# Patient Record
Sex: Female | Born: 1940 | Race: White | Hispanic: No | Marital: Married | Smoking: Never smoker
Health system: Southern US, Community
[De-identification: ages and names within clinical notes are randomized; demographics above are authoritative.]

## PROBLEM LIST (undated history)

## (undated) DIAGNOSIS — E119 Type 2 diabetes mellitus without complications: Secondary | ICD-10-CM

## (undated) DIAGNOSIS — I10 Essential (primary) hypertension: Secondary | ICD-10-CM

## (undated) DIAGNOSIS — IMO0001 Reserved for inherently not codable concepts without codable children: Secondary | ICD-10-CM

## (undated) DIAGNOSIS — T8859XA Other complications of anesthesia, initial encounter: Secondary | ICD-10-CM

## (undated) DIAGNOSIS — R112 Nausea with vomiting, unspecified: Secondary | ICD-10-CM

## (undated) DIAGNOSIS — J449 Chronic obstructive pulmonary disease, unspecified: Secondary | ICD-10-CM

## (undated) DIAGNOSIS — N390 Urinary tract infection, site not specified: Secondary | ICD-10-CM

## (undated) DIAGNOSIS — Z9889 Other specified postprocedural states: Secondary | ICD-10-CM

## (undated) DIAGNOSIS — T4145XA Adverse effect of unspecified anesthetic, initial encounter: Secondary | ICD-10-CM

## (undated) DIAGNOSIS — W19XXXA Unspecified fall, initial encounter: Secondary | ICD-10-CM

## (undated) HISTORY — PX: OTHER SURGICAL HISTORY: SHX169

## (undated) HISTORY — PX: RECTAL SURGERY: SHX760

## (undated) HISTORY — PX: ABDOMINAL HYSTERECTOMY: SHX81

## (undated) HISTORY — PX: HERNIA REPAIR: SHX51

---

## 1999-09-08 ENCOUNTER — Encounter: Payer: Self-pay | Admitting: Internal Medicine

## 1999-09-08 ENCOUNTER — Ambulatory Visit (HOSPITAL_COMMUNITY): Admission: RE | Admit: 1999-09-08 | Discharge: 1999-09-08 | Payer: Self-pay | Admitting: Internal Medicine

## 1999-09-15 ENCOUNTER — Ambulatory Visit (HOSPITAL_COMMUNITY): Admission: RE | Admit: 1999-09-15 | Discharge: 1999-09-15 | Payer: Self-pay | Admitting: Gastroenterology

## 2000-09-21 ENCOUNTER — Ambulatory Visit (HOSPITAL_COMMUNITY): Admission: RE | Admit: 2000-09-21 | Discharge: 2000-09-21 | Payer: Self-pay | Admitting: Internal Medicine

## 2000-09-21 ENCOUNTER — Encounter: Payer: Self-pay | Admitting: Internal Medicine

## 2002-04-10 ENCOUNTER — Encounter: Payer: Self-pay | Admitting: Internal Medicine

## 2002-04-10 ENCOUNTER — Encounter: Admission: RE | Admit: 2002-04-10 | Discharge: 2002-04-10 | Payer: Self-pay | Admitting: Internal Medicine

## 2002-08-31 ENCOUNTER — Encounter: Payer: Self-pay | Admitting: Emergency Medicine

## 2002-08-31 ENCOUNTER — Inpatient Hospital Stay (HOSPITAL_COMMUNITY): Admission: EM | Admit: 2002-08-31 | Discharge: 2002-09-04 | Payer: Self-pay | Admitting: Emergency Medicine

## 2002-09-01 ENCOUNTER — Encounter: Payer: Self-pay | Admitting: Internal Medicine

## 2003-09-05 ENCOUNTER — Ambulatory Visit (HOSPITAL_COMMUNITY): Admission: RE | Admit: 2003-09-05 | Discharge: 2003-09-05 | Payer: Self-pay | Admitting: Gastroenterology

## 2004-03-13 ENCOUNTER — Encounter: Admission: RE | Admit: 2004-03-13 | Discharge: 2004-03-13 | Payer: Self-pay | Admitting: Internal Medicine

## 2004-03-31 ENCOUNTER — Encounter: Admission: RE | Admit: 2004-03-31 | Discharge: 2004-03-31 | Payer: Self-pay | Admitting: Internal Medicine

## 2004-09-09 ENCOUNTER — Ambulatory Visit (HOSPITAL_COMMUNITY): Admission: RE | Admit: 2004-09-09 | Discharge: 2004-09-09 | Payer: Self-pay | Admitting: Family Medicine

## 2006-01-04 ENCOUNTER — Encounter: Admission: RE | Admit: 2006-01-04 | Discharge: 2006-01-04 | Payer: Self-pay | Admitting: Interventional Cardiology

## 2006-01-05 ENCOUNTER — Inpatient Hospital Stay (HOSPITAL_BASED_OUTPATIENT_CLINIC_OR_DEPARTMENT_OTHER): Admission: RE | Admit: 2006-01-05 | Discharge: 2006-01-05 | Payer: Self-pay | Admitting: Interventional Cardiology

## 2006-02-02 ENCOUNTER — Ambulatory Visit (HOSPITAL_BASED_OUTPATIENT_CLINIC_OR_DEPARTMENT_OTHER): Admission: RE | Admit: 2006-02-02 | Discharge: 2006-02-02 | Payer: Self-pay | Admitting: Surgery

## 2006-02-02 ENCOUNTER — Encounter (INDEPENDENT_AMBULATORY_CARE_PROVIDER_SITE_OTHER): Payer: Self-pay | Admitting: *Deleted

## 2006-03-08 ENCOUNTER — Encounter (INDEPENDENT_AMBULATORY_CARE_PROVIDER_SITE_OTHER): Payer: Self-pay | Admitting: *Deleted

## 2006-03-08 ENCOUNTER — Ambulatory Visit (HOSPITAL_COMMUNITY): Admission: RE | Admit: 2006-03-08 | Discharge: 2006-03-08 | Payer: Self-pay | Admitting: General Surgery

## 2006-12-03 ENCOUNTER — Ambulatory Visit (HOSPITAL_COMMUNITY): Admission: RE | Admit: 2006-12-03 | Discharge: 2006-12-03 | Payer: Self-pay | Admitting: Internal Medicine

## 2006-12-22 ENCOUNTER — Ambulatory Visit: Payer: Self-pay | Admitting: Vascular Surgery

## 2007-01-28 ENCOUNTER — Encounter: Admission: RE | Admit: 2007-01-28 | Discharge: 2007-01-28 | Payer: Self-pay | Admitting: Internal Medicine

## 2007-07-07 ENCOUNTER — Encounter: Admission: RE | Admit: 2007-07-07 | Discharge: 2007-07-07 | Payer: Self-pay | Admitting: Internal Medicine

## 2007-10-13 ENCOUNTER — Encounter: Admission: RE | Admit: 2007-10-13 | Discharge: 2007-10-13 | Payer: Self-pay | Admitting: Internal Medicine

## 2007-11-11 ENCOUNTER — Encounter: Admission: RE | Admit: 2007-11-11 | Discharge: 2007-11-11 | Payer: Self-pay | Admitting: Internal Medicine

## 2008-02-02 ENCOUNTER — Ambulatory Visit (HOSPITAL_COMMUNITY): Admission: RE | Admit: 2008-02-02 | Discharge: 2008-02-03 | Payer: Self-pay | Admitting: Surgery

## 2008-07-09 ENCOUNTER — Encounter: Admission: RE | Admit: 2008-07-09 | Discharge: 2008-07-09 | Payer: Self-pay | Admitting: Internal Medicine

## 2008-07-09 ENCOUNTER — Ambulatory Visit (HOSPITAL_COMMUNITY): Admission: RE | Admit: 2008-07-09 | Discharge: 2008-07-09 | Payer: Self-pay | Admitting: Internal Medicine

## 2008-11-08 ENCOUNTER — Encounter: Admission: RE | Admit: 2008-11-08 | Discharge: 2008-11-08 | Payer: Self-pay | Admitting: Surgery

## 2009-04-10 ENCOUNTER — Encounter: Admission: RE | Admit: 2009-04-10 | Discharge: 2009-04-10 | Payer: Self-pay | Admitting: Orthopedic Surgery

## 2009-04-24 ENCOUNTER — Encounter: Admission: RE | Admit: 2009-04-24 | Discharge: 2009-04-24 | Payer: Self-pay | Admitting: Orthopedic Surgery

## 2009-05-09 ENCOUNTER — Encounter: Admission: RE | Admit: 2009-05-09 | Discharge: 2009-05-09 | Payer: Self-pay | Admitting: Orthopedic Surgery

## 2009-06-24 ENCOUNTER — Encounter: Admission: RE | Admit: 2009-06-24 | Discharge: 2009-06-24 | Payer: Self-pay | Admitting: Surgery

## 2009-11-08 ENCOUNTER — Encounter: Admission: RE | Admit: 2009-11-08 | Discharge: 2009-11-08 | Payer: Self-pay | Admitting: Internal Medicine

## 2009-12-25 ENCOUNTER — Encounter: Payer: Self-pay | Admitting: Endocrinology

## 2010-01-03 ENCOUNTER — Encounter: Admission: RE | Admit: 2010-01-03 | Discharge: 2010-01-03 | Payer: Self-pay | Admitting: Internal Medicine

## 2010-01-06 ENCOUNTER — Ambulatory Visit: Payer: Self-pay | Admitting: Endocrinology

## 2010-01-06 DIAGNOSIS — I10 Essential (primary) hypertension: Secondary | ICD-10-CM | POA: Insufficient documentation

## 2010-01-06 DIAGNOSIS — J45909 Unspecified asthma, uncomplicated: Secondary | ICD-10-CM

## 2010-01-06 DIAGNOSIS — G629 Polyneuropathy, unspecified: Secondary | ICD-10-CM

## 2010-01-06 DIAGNOSIS — E109 Type 1 diabetes mellitus without complications: Secondary | ICD-10-CM | POA: Insufficient documentation

## 2010-01-06 DIAGNOSIS — F419 Anxiety disorder, unspecified: Secondary | ICD-10-CM

## 2010-01-13 ENCOUNTER — Ambulatory Visit: Payer: Self-pay | Admitting: Endocrinology

## 2010-02-04 ENCOUNTER — Ambulatory Visit: Payer: Self-pay | Admitting: Endocrinology

## 2010-02-18 ENCOUNTER — Telehealth: Payer: Self-pay | Admitting: Endocrinology

## 2010-02-25 ENCOUNTER — Ambulatory Visit: Payer: Self-pay | Admitting: Endocrinology

## 2010-03-17 ENCOUNTER — Ambulatory Visit: Payer: Self-pay | Admitting: Internal Medicine

## 2010-04-16 ENCOUNTER — Ambulatory Visit: Payer: Self-pay | Admitting: Internal Medicine

## 2010-04-16 ENCOUNTER — Ambulatory Visit: Payer: Self-pay | Admitting: Endocrinology

## 2010-06-11 ENCOUNTER — Encounter: Admission: RE | Admit: 2010-06-11 | Discharge: 2010-06-11 | Payer: Self-pay | Admitting: Surgery

## 2010-09-16 ENCOUNTER — Encounter
Admission: RE | Admit: 2010-09-16 | Discharge: 2010-09-16 | Payer: Self-pay | Source: Home / Self Care | Attending: Internal Medicine | Admitting: Internal Medicine

## 2010-10-25 ENCOUNTER — Encounter: Payer: Self-pay | Admitting: Family Medicine

## 2010-10-26 ENCOUNTER — Encounter: Payer: Self-pay | Admitting: Surgery

## 2010-11-04 NOTE — Progress Notes (Signed)
Summary: samples  Phone Note Call from Patient   Caller: Patient [phone] Summary of Call: pt called stating that she was told by MD at last OV that she could call back to pick up samples of Lantus, Novolog and Humolog as they were not available at OV. please advise Initial call taken by: Margaret Pyle, CMA,  Feb 18, 2010 9:33 AM  Follow-up for Phone Call        please note policy.  i did not tell pt she could come in just to pick up samples.  i'll check again at your appt next week. Follow-up by: Minus Breeding MD,  Feb 18, 2010 9:38 AM  Additional Follow-up for Phone Call Additional follow up Details #1::        pt informed via VM Additional Follow-up by: Margaret Pyle, CMA,  Feb 18, 2010 9:51 AM

## 2010-11-04 NOTE — Assessment & Plan Note (Signed)
Summary: 3 wk f/u // # / cd   Vital Signs:  Patient profile:   70 year old female Height:      61 inches (154.94 cm) Weight:      189.13 pounds (85.97 kg) O2 Sat:      93 % on Room air Temp:     97.4 degrees F (36.33 degrees C) oral Pulse rate:   98 / minute BP sitting:   130 / 64  (left arm) Cuff size:   large  Vitals Entered By: Josph Macho RMA (Feb 25, 2010 9:12 AM)  O2 Flow:  Room air CC: 3 week follow up/ CF Is Patient Diabetic? Yes   Referring Provider:  Polite Primary Provider:  Polite  CC:  3 week follow up/ CF.  History of Present Illness: she brings a record of her cbg's which i have reviewed today.  it is consistently 200's in am.  then it often goes down to 100 before lunch and at hs.  it is much higher in am than at hs, even if she has milk only at hs.  pt states she feels well in general, except for fatigue.    Current Medications (verified): 1)  Lantus 100 Unit/ml Soln (Insulin Glargine) .... 40 Units At Bedtime 2)  Lyrica 75 Mg Caps (Pregabalin) .... Two Times A Day As Needed 3)  Diovan Hct 160-12.5 Mg Tabs (Valsartan-Hydrochlorothiazide) .... Once Daily 4)  Citalopram Hydrobromide 20 Mg Tabs (Citalopram Hydrobromide) .... Once Daily 5)  Furosemide 20 Mg Tabs (Furosemide) .... Once Daily As Needed 6)  Alprazolam 2 Mg Tabs (Alprazolam) .... Gtts As Needed 7)  Advair Albuterol Inhaler .... As Needed 8)  Singulair 10 Mg Tabs (Montelukast Sodium) .... Once Daily 9)  Novolog 100 Unit/ml Soln (Insulin Aspart) .... Three Times A Day (Just Before Each Meal) 35-10-35 Units, and Pen Needles 4x A Day 10)  Potassium .Marland Kitchen.. 1 By Mouth Qd  Allergies (verified): 1)  ! Lodine  Past History:  Past Medical History: Last updated: 01/06/2010 ANXIETY (ICD-300.00) ASTHMA (ICD-493.90) HYPERTENSION (ICD-401.9) POLYNEUROPATHY (ICD-357.9) IDDM (ICD-250.01)  Review of Systems  The patient denies hypoglycemia.    Physical Exam  General:  normal appearance.   Psych:   Alert and cooperative; normal mood and affect; normal attention span and concentration.     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 50 units at bedtime 2)  Novolog 100 Unit/ml Soln (Insulin aspart) .... Three times a day (just before each meal) 30-10-30 units, and pen needles 4x a day  Other Orders: Est. Patient Level III (16109)  Patient Instructions: 1)  check your blood sugar 2 times a day.  vary the time of day when you check, between before the 3 meals, and at bedtime.  also check if you have symptoms of your blood sugar being too high or too low.  please keep a record of the readings and bring it to your next appointment here.  please call us sooner if you are having low blood sugar episodes. 2)  increase lantus to 50 units at bedtime 3)  change novolog to a three times a day (just before each meal), 30-10-30 units. 4)  Please schedule a follow-up appointment in 6 weeks.

## 2010-11-04 NOTE — Assessment & Plan Note (Signed)
Summary: Pulmonary/  summary ov with hfa only 25%/ dulera count off 93   Copy to:  Polite Primary Provider/Referring Provider:  Polite  CC:  followup on pfts and c/o chest tightness and increased sob when outside and at night wake up gasping.  History of Present Illness: 5 yowf never smoker prematurely born at 7 months and 2 lbs and lifelong asthma but never learned how to use inhalers effectively.  March 17, 2010  1st pulmonary office eval  cc doe x across parking lot, harris teeter ok if goes slow.  moderate variability, better after saba and assoc with nasal congestion, itching and sneezing.  No worse lately (learned to live with it) rec try dulera 200 > helped some but poor technique noted, gerd diet reviewed.  April 16, 2010 ov followup on pfts, c/o chest tightness and increased sob when outside and at night wake up gasping  - still not using correctly. still needing rescue one daily on avg ? proaire. Pt denies any significant sore throat, dysphagia, itching, sneezing,  nasal congestion or excess secretions,  fever, chills, sweats, unintended wt loss, pleuritic or exertional cp, hempoptysis, change in activity tolerance   leg swelling.  Pt also denies any obvious fluctuation in symptoms with weather or environmental change or other alleviating or aggravating factors.           Preventive Screening-Counseling & Management  Alcohol-Tobacco     Smoking Status: never  Current Medications (verified): 1)  Lantus 100 Unit/ml Soln (Insulin Glargine) .... 60 Units At Bedtime 2)  Novolog 100 Unit/ml Soln (Insulin Aspart) .... Three Times A Day (Just Before Each Meal) 20-10-30 Units, and Pen Needles 4x A Day 3)  Lyrica 75 Mg Caps (Pregabalin) .... Two Times A Day As Needed 4)  Diovan Hct 160-12.5 Mg Tabs (Valsartan-Hydrochlorothiazide) .... Once Daily 5)  Citalopram Hydrobromide 20 Mg Tabs (Citalopram Hydrobromide) .... Once Daily 6)  Furosemide 20 Mg Tabs (Furosemide) .... Once Daily As  Needed 7)  Alprazolam 2 Mg Tabs (Alprazolam) .Marland Kitchen.. 1 By Mouth As Needed 8)  Albuterol Sulfate (2.5 Mg/44ml) 0.083% Nebu (Albuterol Sulfate) .Marland Kitchen.. 1 Vial in Nebulizer Once Daily As Needed 9)  Dulera 200-5 Mcg/act Aero (Mometasone Furo-Formoterol Fum) .... 2 Puffs First Thing  in Am and 2 Puffs Again in Pm About 12 Hours Later  Allergies: 1)  ! 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)  Social History: Smoking Status:  never  Vital Signs:  Patient profile:   70 year old female Height:      61 inches Weight:      193 pounds BMI:     36.60 O2 Sat:      96 % on Room air Temp:     98.4 degrees F oral Pulse rate:   94 / minute BP sitting:   120 / 60  (left arm) Cuff size:   regular  Vitals Entered By: Kandice Hams CMA (April 16, 2010 9:54 AM)  O2 Flow:  Room air CC: followup on pfts, c/o chest tightness and increased sob when outside and at night wake up gasping   Physical Exam  Additional Exam:  obese pleasant wf nad wt 191 March 17, 2010 > 193 April 16, 2010  HEENT: nl dentition, turbinates, and orophanx. Nl external ear canals without cough reflex NECK :  without JVD/Nodes/TM/ nl carotid upstrokes bilaterally LUNGS: no acc muscle use, distant wheeze pan exp bilaterally  CV:  RRR  no s3 or murmur or increase in P2, no edema  ABD:  soft and nontender with nl excursion in the supine position. No bruits or organomegaly, bowel sounds nl MS:  warm without deformities, calf tenderness, cyanosis or clubbing     Impression & Recommendations:  Problem # 1:  ASTHMA (ICD-493.90) Moderate chronic asthma     DDX of  difficult airways managment all start with A and  include Adherence, Ace Inhibitors, Acid Reflux, Active Sinus Disease, Alpha  1 Antitripsin deficiency, Anxiety masquerading as Airways dz,  ABPA,  allergy(esp in young), Aspiration (esp in elderly), Adverse effects of DPI,  Active smokers, plus one B  = Beta blocker use..    Adherence is biggest hurdle. has not triggered dulera but 27 times in a month and still not using effectively.  I spent extra time with the patient today explaining optimal mdi  technique.  This improved from  10-25% but no better.    Acid reflux diet reviewed.  Medications Added to Medication List This Visit: 1)  Alprazolam 2 Mg Tabs (Alprazolam) .Marland Kitchen.. 1 by mouth as needed  Other Orders: Est. Patient Level IV (04540) HFA Instruction [phone])  Patient Instructions: 1)  Work on inhaler technique:  relax and blow all the way out then take a nice smooth deep breath back in, triggering the inhaler at same time you start breathing in and hold a few seconds  2)  Please schedule a follow-up appointment in 6 weeks, sooner if needed  Prescriptions: DULERA 200-5 MCG/ACT AERO (MOMETASONE FURO-FORMOTEROL FUM) 2 puffs first thing  in am and 2 puffs again in pm about 12 hours later  #1 x 11   Entered and Authorized by:   Nyoka Cowden MD   Signed by:   Nyoka Cowden MD on 04/16/2010   Method used:   Electronically to        [facility] [address] [phone]* (retail)       [address] [place], [place]  [address]       Ph: [phone]       Fax: [phone]   RxID:   [id number]

## 2010-11-04 NOTE — Assessment & Plan Note (Signed)
Summary: NEW ENDO MEDICARE DIABETES PT-PKG-#PER BELINDA/DR POLITE-STC   Vital Signs:  Patient profile:   70 year old female Height:      61 inches (154.94 cm) Weight:      189.50 pounds (86.14 kg) BMI:     35.94 O2 Sat:      90 % on Room air Temp:     98.2 degrees F (36.78 degrees C) oral Pulse rate:   88 / minute BP sitting:   128 / 68  (left arm) Cuff size:   large  Vitals Entered By: Josph Macho RMA (January 06, 2010 9:43 AM)  O2 Flow:  Room air CC: New Endo: Diabetes/ CF Is Patient Diabetic? Yes   Referring Provider:  Polite Primary Provider:  Polite  CC:  New Endo: Diabetes/ CF.  History of Present Illness: pt states 18 years h/o dm.  it is complicated by retinopathy.  she has been on insulin since soon after dx of dm.  she takes lantus, and prn novolog.  no cbg record, but states cbg's are not well-controlled.  she describes it as extremely variable.  it is in general higher as the day goes on. pt says her diet and exercise are "poor."   symptomatically, pt states few years of moderate numbness of the legs, and associated pain.   Current Medications (verified): 1)  Lantus 100 Unit/ml Soln (Insulin Glargine) .... 37 Units Qam & 35 Units Qpm 2)  Sliding Scale 3)  Lyrica 75 Mg Caps (Pregabalin) .... Two Times A Day As Needed 4)  Diovan Hct 160-12.5 Mg Tabs (Valsartan-Hydrochlorothiazide) .... Once Daily 5)  Citalopram Hydrobromide 20 Mg Tabs (Citalopram Hydrobromide) .... Once Daily 6)  Metformin Hcl 500 Mg Tabs (Metformin Hcl) .... 2 Two Times A Day 7)  Glyburide 5 Mg Tabs (Glyburide) .... 2 Two Times A Day 8)  Furosemide 20 Mg Tabs (Furosemide) .... Once Daily As Needed 9)  Alprazolam 2 Mg Tabs (Alprazolam) .... Gtts As Needed 10)  Advair Albuterol Inhaler .... As Needed 11)  Singulair 10 Mg Tabs (Montelukast Sodium) .... Once Daily  Allergies (verified): 1)  ! Lodine  Past History:  Past Medical History: ANXIETY (ICD-300.00) ASTHMA  (ICD-493.90) HYPERTENSION (ICD-401.9) POLYNEUROPATHY (ICD-357.9) IDDM (ICD-250.01)  Family History: Reviewed history and no changes required. dm:  mother (deceased), and 2 brother (1 deceased), 2 sisters  Social History: Reviewed history and no changes required. married retired never a smoker  Review of Systems       The patient complains of weight gain, headaches, and depression.         denies blurry vision, chest pain, n/v, urinary frequency, excessive diaphoresis, memory loss, and menopausal sxs.  she reports leg swelling.  she attributes doe to asthma.  she reports muscle cramps, rhinorrhea, and easy bruising   Physical Exam  Extremities:  no deformity.  no ulcer on the feet.  feet are of normal color and temp.  there is bilateral onychomycosis, and ingrown toenails. trace right pedal edema and trace left pedal edema.   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  these complicate the rx of #1  Medications Added to Medication List This Visit: 1)  Lantus 100 Unit/ml Soln (Insulin glargine) .... 37 units qam & 35 units  qpm 2)  Lantus 100 Unit/ml Soln (Insulin glargine) .... 35 units two times a day 3)  Sliding Scale  4)  Lyrica 75 Mg Caps (Pregabalin) .... Two times a day as needed 5)  Diovan Hct 160-12.5 Mg Tabs (Valsartan-hydrochlorothiazide) .... Once daily 6)  Citalopram Hydrobromide 20 Mg Tabs (Citalopram hydrobromide) .... Once daily 7)  Metformin Hcl 500 Mg Tabs (Metformin hcl) .... 2 two times a day 8)  Glyburide 5 Mg Tabs (Glyburide) .... 2 two times a day 9)  Furosemide 20 Mg Tabs (Furosemide) .... Once daily as needed 10)  Alprazolam 2 Mg Tabs (Alprazolam) .... Gtts as needed 11)  Advair Albuterol Inhaler  .... As needed 12)   Singulair 10 Mg Tabs (Montelukast sodium) .... Once daily 13)  Novolog 100 Unit/ml Soln (Insulin aspart) .Marland Kitchen.. 10 units three times a day (just before each meal)  Other Orders: Consultation Level IV (16109)   Patient Instructions: 1)  good diet and exercise habits significanly improve the control of your diabetes.  high blood sugar is very risky to your health.  you should see an eye doctor every year. 2)  controlling your blood pressure and cholesterol drastically reduces the damage diabetes does to your body.  this also applies to quitting smoking.  please discuss these with your doctor.  you should take an aspirin every day, unless you have been advised by a doctor not to. 3)  check your blood sugar 2 times a day.  vary the time of day when you check, between before the 3 meals, and at bedtime.  also check if you have symptoms of your blood sugar being too high or too low.  please keep a record of the readings and bring it to your next appointment here.  please call us sooner if you are having low blood sugar episodes. 4)  we will need to take this complex situation in stages. 5)  continue lantus 35 units two times a day 6)  stop metformin and glyburide. 7)  change novolog to a scheduled amount, 10 units three times a day (just before each meal). 8)  Please schedule a follow-up appointment in 1 week.

## 2010-11-04 NOTE — Assessment & Plan Note (Signed)
Summary: Pulmonary/ new pt eval HFA < 25 % effective   Visit Type:  Initial Consult Copy to:  Polite Primary Provider/Referring Provider:  Polite  CC:  Asthma.  History of Present Illness: 70 yowf never smoker prematurely born at 7 months and 2 lbs and lifelong asthma.  March 17, 2010 cc doe x across parking lot, harris teeter ok if goes slow.  moderate variability, better after saba and assoc with nasal congestion, itching and sneezing.  No worse lately (learned to live with it)    Pt denies any significant sore throat, dysphagia, excess or purulent nasal secretions,  fever, chills, sweats, unintended wt loss, pleuritic or exertional cp, hempoptysis, change in activity tolerance  orthopnea pnd or leg swelling Pt also denies any obvious fluctuation in symptoms with weather or environmental change or other alleviating or aggravating factors.       Current Medications (verified): 1)  Lantus 100 Unit/ml Soln (Insulin Glargine) .... 50 Units At Bedtime 2)  Novolog 100 Unit/ml Soln (Insulin Aspart) .... Three Times A Day (Just Before Each Meal) 30-10-30 Units, and Pen Needles 4x A Day 3)  Lyrica 75 Mg Caps (Pregabalin) .... Two Times A Day As Needed 4)  Diovan Hct 160-12.5 Mg Tabs (Valsartan-Hydrochlorothiazide) .... Once Daily 5)  Citalopram Hydrobromide 20 Mg Tabs (Citalopram Hydrobromide) .... Once Daily 6)  Furosemide 20 Mg Tabs (Furosemide) .... Once Daily As Needed 7)  Alprazolam 2 Mg Tabs (Alprazolam) .... Gtts As Needed 8)  Advair Diskus 250-50 Mcg/dose Aepb (Fluticasone-Salmeterol) .Marland Kitchen.. 1 Puff Two Times A Day 9)  Singulair 10 Mg Tabs (Montelukast Sodium) .... Once Daily 10)  Metformin Hcl 500 Mg Tabs (Metformin Hcl) .... 2 Two Times A Day 11)  Glyburide 5 Mg Tabs (Glyburide) .... 2 Two Times A Day 12)  Albuterol Sulfate (2.5 Mg/53ml) 0.083% Nebu (Albuterol Sulfate) .Marland Kitchen.. 1 Vial in Nebulizer Once Daily As Needed  Allergies (verified): 1)  ! 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  The patient denies productive cough, coughing up blood, chest pain, irregular heartbeats, acid heartburn, indigestion, loss of appetite, weight change, difficulty swallowing, sore throat, tooth/dental problems, headaches, sneezing, ear ache, rash, change in color of mucus, and fever.    Vital Signs:  Patient profile:   70 year old female Weight:      191 pounds O2 Sat:      94 % on Room air Temp:     98.3 degrees F oral Pulse rate:   85 / minute BP sitting:   132 / 70  (left arm)  Vitals Entered By: Vernie Murders (March 17, 2010 9:48 AM)  O2 Flow:  Room air  Physical Exam  Additional Exam:  obese pleasant wf nad wt 191 March 17, 2010  HEENT: nl dentition, turbinates, and orophanx. Nl external ear canals without cough reflex NECK :  without JVD/Nodes/TM/ nl carotid upstrokes bilaterally LUNGS: no acc muscle use, distant wheeze pan exp bilaterally  CV:  RRR  no s3 or murmur or increase in P2, no edema  ABD:  soft and nontender with nl excursion in the supine position. No bruits or organomegaly, bowel sounds nl MS:  warm without deformities, calf tenderness, cyanosis or clubbing SKIN: warm and dry without lesions   NEURO:  alert, approp, no deficits     Impression & Recommendations:  Problem # 1:  ASTHMA (ICD-493.90) Poor control chronically.     DDX of  difficult airways managment  all start with A and  include Adherence, Ace Inhibitors, Acid Reflux, Active Sinus Disease, Alpha 1 Antitripsin deficiency, Anxiety masquerading as Airways dz,  ABPA,  allergy(esp in young), Aspiration (esp in elderly), Adverse effects of DPI,  Active smokers, plus one B  = Beta blocker use..    ? Acid reflux diet reviewed  ? Adverse effect of DPI  > try dulera  I spent extra time with the patient today explaining optimal mdi  technique.  This improved from  25-50% but no better so if unable to master by next ov option : change to Brovana and Budesonide, the equivalent of Symbicort, per neb.   Medications Added to Medication List This Visit: 1)  Advair Diskus 250-50 Mcg/dose Aepb (Fluticasone-salmeterol) .Marland Kitchen.. 1 puff two times a day 2)  Metformin Hcl 500 Mg Tabs (Metformin hcl) .... 2 two times a day 3)  Glyburide 5 Mg Tabs (Glyburide) .... 2 two times a day 4)  Albuterol Sulfate (2.5 Mg/54ml) 0.083% Nebu (Albuterol sulfate) .Marland Kitchen.. 1 vial in nebulizer once daily as needed 5)  Dulera 200-5 Mcg/act Aero (Mometasone furo-formoterol fum) .... 2 puffs first thing  in am and 2 puffs again in pm about 12 hours later  Other Orders: New Patient Level V (16073)  Patient Instructions: 1)  Dulera 200 2 puffs first thing  in am and 2 puffs again in pm about 12 hours later  2)  Work on inhaler technique:  relax and blow all the way out then take a nice smooth deep breath back in, triggering the inhaler at same time you start breathing in  3)  if needed ok to your rescue inhaler Proaire up to every 4 hours but the goal is not to need it all 4)  GERD (REFLUX)  is a common cause of respiratory symptoms. It commonly presents without heartburn and can be treated with medication, but also with lifestyle changes including avoidance of late meals, excessive alcohol, smoking cessation, and avoid fatty foods, chocolate, peppermint, colas, red wine, and acidic juices such as orange juice. NO MINT OR MENTHOL PRODUCTS SO NO  COUGH DROPS  5)  USE SUGARLESS CANDY INSTEAD (jolley ranchers)  6)  NO OIL BASED VITAMINS  7)  Please schedule a follow-up appointment in 4 weeks with PFT's

## 2010-11-04 NOTE — Assessment & Plan Note (Signed)
Summary: 3 wk rov /nws  #   Vital Signs:  Patient profile:   70 year old female Height:      61 inches Weight:      193.25 pounds BMI:     36.65 O2 Sat:      90 % on Room air Temp:     98.6 degrees F oral Pulse rate:   96 / minute BP sitting:   150 / 70  (left arm) Cuff size:   large  Vitals Entered By: Lucious Groves (Feb 04, 2010 1:13 PM)  O2 Flow:  Room air CC: 3 wk rtn ov./kb Is Patient Diabetic? Yes Pain Assessment Patient in pain? no        Referring Provider:  Polite Primary Provider:  Polite  CC:  3 wk rtn ov./kb.  History of Present Illness: no cbg record, but states cbg's are higher at hs than in am.  it is very high (300) at all other times of day, except for the afternoon.  pt states she feels well in general.  Current Medications (verified): 1)  Lantus 100 Unit/ml Soln (Insulin Glargine) .... 25 Units Two Times A Day 2)  Lyrica 75 Mg Caps (Pregabalin) .... Two Times A Day As Needed 3)  Diovan Hct 160-12.5 Mg Tabs (Valsartan-Hydrochlorothiazide) .... Once Daily 4)  Citalopram Hydrobromide 20 Mg Tabs (Citalopram Hydrobromide) .... Once Daily 5)  Furosemide 20 Mg Tabs (Furosemide) .... Once Daily As Needed 6)  Alprazolam 2 Mg Tabs (Alprazolam) .... Gtts As Needed 7)  Advair Albuterol Inhaler .... As Needed 8)  Singulair 10 Mg Tabs (Montelukast Sodium) .... Once Daily 9)  Novolog 100 Unit/ml Soln (Insulin Aspart) .... Three Times A Day (Just Before Each Meal) 20-10-20 Units 10)  Humalog Kwikpen 100 Unit/ml Soln (Insulin Lispro (Human)) .... Three Times A Day (Qac) 20-10-20 Units, 11)  Potassium .Marland Kitchen.. 1 By Mouth Qd  Allergies (verified): 1)  ! 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 40 units at bedtime 2)  Novolog 100 Unit/ml Soln (Insulin aspart) .... Three times a day (just before each meal) 35-10-35 units, and pen needles 4x a day 3)  Potassium  .Marland KitchenMarland Kitchen. 1 by mouth qd  Other Orders: Est. Patient Level III (14782)  Patient Instructions: 1)  check your blood sugar 2 times a day.  vary the time of day when you check, between before the 3 meals, and at bedtime.  also check if you have symptoms of your blood sugar being too high or too low.  please keep a record of the readings and bring it to your next appointment here.  please call us sooner if you are having low blood sugar episodes. 2)  we will need to take this complex situation in stages. 3)  reduce lantus to 40 units at bedtime 4)  change novolog to a three times a day (just before each meal), 35-10-35 units. 5)  Please schedule a follow-up appointment in 2 weeks.

## 2010-11-04 NOTE — Assessment & Plan Note (Signed)
Summary: 1 WK FU  STC   Vital Signs:  Patient profile:   70 year old female Height:      61 inches (154.94 cm) Weight:      190.38 pounds (86.54 kg) O2 Sat:      94 % on Room air Temp:     98.4 degrees F (36.89 degrees C) oral Pulse rate:   86 / minute BP sitting:   112 / 58  (left arm) Cuff size:   large  Vitals Entered By: Josph Macho RMA (January 13, 2010 8:17 AM)  O2 Flow:  Room air CC: 1 week follow up/ CF Is Patient Diabetic? Yes   Referring Provider:  Polite Primary Provider:  Polite  CC:  1 week follow up/ CF.  History of Present Illness: she brings a record of her cbg's which i have reviewed today.  it increased when her oral agents were stopped.  it varies from 70 in am to 400 (hs).  leg pain persists.  Current Medications (verified): 1)  Lantus 100 Unit/ml Soln (Insulin Glargine) .... 35 Units Two Times A Day 2)  Lyrica 75 Mg Caps (Pregabalin) .... Two Times A Day As Needed 3)  Diovan Hct 160-12.5 Mg Tabs (Valsartan-Hydrochlorothiazide) .... Once Daily 4)  Citalopram Hydrobromide 20 Mg Tabs (Citalopram Hydrobromide) .... Once Daily 5)  Furosemide 20 Mg Tabs (Furosemide) .... Once Daily As Needed 6)  Alprazolam 2 Mg Tabs (Alprazolam) .... Gtts As Needed 7)  Advair Albuterol Inhaler .... As Needed 8)  Singulair 10 Mg Tabs (Montelukast Sodium) .... Once Daily 9)  Novolog 100 Unit/ml Soln (Insulin Aspart) .Marland Kitchen.. 10 Units Three Times A Day (Just Before Each Meal)  Allergies (verified): 1)  ! Lodine  Past History:  Past Medical History: Last updated: 01/06/2010 ANXIETY (ICD-300.00) ASTHMA (ICD-493.90) HYPERTENSION (ICD-401.9) POLYNEUROPATHY (ICD-357.9) IDDM (ICD-250.01)  Review of Systems  The patient denies syncope.    Physical Exam  General:  normal appearance.   Psych:  Alert and cooperative; normal mood and affect; normal attention span and concentration.     Impression & Recommendations:  Problem # 1:  IDDM (ICD-250.01) the pattern of her  cbg's indicates she needs less lantus, and more novolog  Medications Added to Medication List This Visit: 1)  Lantus 100 Unit/ml Soln (Insulin glargine) .... 25 units two times a day 2)  Novolog 100 Unit/ml Soln (Insulin aspart) .... Three times a day (just before each meal) 20-10-20 units 3)  Humalog Kwikpen 100 Unit/ml Soln (Insulin lispro (human)) .... Three times a day (qac) 20-10-20 units,  Other Orders: Est. Patient Level III (16109)  Patient Instructions: 1)  check your blood sugar 2 times a day.  vary the time of day when you check, between before the 3 meals, and at bedtime.  also check if you have symptoms of your blood sugar being too high or too low.  please keep a record of the readings and bring it to your next appointment here.  please call us sooner if you are having low blood sugar episodes. 2)  we will need to take this complex situation in stages. 3)  reduce lantus to 25 units two times a day 4)  change novolog to a three times a day (just before each meal), 20-10-20 units. 5)  Please schedule a follow-up appointment in 3 weeks. 6)  please see dr polite to follow-up you leg pain. Prescriptions: HUMALOG KWIKPEN 100 UNIT/ML SOLN (INSULIN LISPRO (HUMAN)) three times a day (qac) 20-10-20 units,  #1  box x 0   Entered and Authorized by:   Minus Breeding MD   Signed by:   Minus Breeding MD on 01/13/2010   Method used:   Print then Give to Patient   RxID:   [id number]

## 2010-11-04 NOTE — Assessment & Plan Note (Signed)
Summary: 6 WK FU   STC   RS'D PER PT/NWS   Vital Signs:  Patient profile:   70 year old female Height:      61 inches (154.94 cm) Weight:      193.50 pounds (87.95 kg) BMI:     36.69 O2 Sat:      93 % on Room air Temp:     98.3 degrees F (36.83 degrees C) oral Pulse rate:   89 / minute BP sitting:   114 / 62  (left arm) Cuff size:   large  Vitals Entered By: Brenton Grills MA (April 16, 2010 8:10 AM)  O2 Flow:  Room air CC: 6 wk f/u/aj   Referring Provider:  Polite Primary Provider:  Polite  CC:  6 wk f/u/aj.  History of Present Illness: she brings a record of her cbg's which i have reviewed today.  it is still highest in am (200's--higher than at hs, despite no hs meal).  it is often 60's at other times of day.  she has fatigue.  Current Medications (verified): 1)  Lantus 100 Unit/ml Soln (Insulin Glargine) .... 50 Units At Bedtime 2)  Novolog 100 Unit/ml Soln (Insulin Aspart) .... Three Times A Day (Just Before Each Meal) 30-10-30 Units, and Pen Needles 4x A Day 3)  Lyrica 75 Mg Caps (Pregabalin) .... Two Times A Day As Needed 4)  Diovan Hct 160-12.5 Mg Tabs (Valsartan-Hydrochlorothiazide) .... Once Daily 5)  Citalopram Hydrobromide 20 Mg Tabs (Citalopram Hydrobromide) .... Once Daily 6)  Furosemide 20 Mg Tabs (Furosemide) .... Once Daily As Needed 7)  Alprazolam 2 Mg Tabs (Alprazolam) .... Gtts As Needed 8)  Singulair 10 Mg Tabs (Montelukast Sodium) .... Once Daily 9)  Metformin Hcl 500 Mg Tabs (Metformin Hcl) .... 2 Two Times A Day 10)  Glyburide 5 Mg Tabs (Glyburide) .... 2 Two Times A Day 11)  Albuterol Sulfate (2.5 Mg/41ml) 0.083% Nebu (Albuterol Sulfate) .Marland Kitchen.. 1 Vial in Nebulizer Once Daily As Needed 12)  Dulera 200-5 Mcg/act Aero (Mometasone Furo-Formoterol Fum) .... 2 Puffs First Thing  in Am and 2 Puffs Again in Pm About 12 Hours Later  Allergies (verified): 1)  ! Lodine  Past History:  Past Medical History: Last updated: 03/17/2010 ANXIETY  (ICD-300.00) ASTHMA (ICD-493.90)     - HFA 25% March 17, 2010  HYPERTENSION (ICD-401.9) POLYNEUROPATHY (ICD-357.9) IDDM (ICD-250.01)  Review of Systems  The patient denies syncope.    Physical Exam  General:  obese.  no distress  Psych:  Alert and cooperative; normal mood and affect; normal attention span and concentration.     Impression & Recommendations:  Problem # 1:  IDDM (ICD-250.01) it is unusual for pt to need so much lantus relative to novolog  Medications Added to Medication List This Visit: 1)  Lantus 100 Unit/ml Soln (Insulin glargine) .... 60 units at bedtime 2)  Novolog 100 Unit/ml Soln (Insulin aspart) .... Three times a day (just before each meal) 20-10-30 units, and pen needles 4x a day  Other Orders: Est. Patient Level III (16010)  Patient Instructions: 1)  check your blood sugar 2 times a day.  vary the time of day when you check, between before the 3 meals, and at bedtime.  also check if you have symptoms of your blood sugar being too high or too low.  please keep a record of the readings and bring it to your next appointment here.  please call us sooner if you are having low blood sugar episodes.  2)  increase lantus to 60 units at bedtime 3)  change novolog to a three times a day (just before each meal), 20-10-30 units. 4)  Please schedule a follow-up appointment in 3-4 weeks.  Preventive Care Screening  Mammogram:    Date:  01/03/2010    Results:  normal

## 2010-11-04 NOTE — Miscellaneous (Signed)
Summary: Orders Update pft charges  Clinical Lists Changes  Orders: Added new Service order of Carbon Monoxide diffusing w/capacity (94720) - Signed Added new Service order of Lung Volumes (94240) - Signed Added new Service order of Spirometry (Pre & Post) (94060) - Signed 

## 2010-11-04 NOTE — Letter (Signed)
Summary: [facility]   Imported By: Lester Lowrys 01/09/2010 10:31:05  _____________________________________________________________________  External Attachment:    Type:   Image     Comment:   External Document
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
## 2011-02-19 ENCOUNTER — Other Ambulatory Visit: Payer: Self-pay | Admitting: Gastroenterology

## 2011-02-20 NOTE — Op Note (Signed)
[name], [name]                    ACCOUNT NO.:  [id number]   MEDICAL RECORD NO.:  [id number]          PATIENT TYPE:  AMB   LOCATION:  DSC                          FACILITY:  [facility]   PHYSICIAN:  Velora Heckler, MD      DATE OF BIRTH:  [birth year]   DATE OF PROCEDURE:  02/02/2006  DATE OF DISCHARGE:                                 OPERATIVE REPORT   PREOPERATIVE DIAGNOSIS:  Anal polyp.   POSTOPERATIVE DIAGNOSIS:  Anal polyp.   PROCEDURE:  Excision anal polyps.   SURGEON:  Velora Heckler, M.D, FACS   ANESTHESIA:  General.   ESTIMATED BLOOD LOSS:  Minimal.   PREPARATION:  Betadine.   COMPLICATIONS:  None.   INDICATIONS:  The patient is a 70 year old white female female from [place].  She has an anal polyp which has been present for a number of years.  It is gradually increased in size.  It prolapses and causes occasional  bleeding and discomfort.  The patient has undergone colonoscopy by Dr. Leary Roca.  He desires that the patient have it excised.   DESCRIPTION OF PROCEDURE:  The procedure was done in OR #6 at the [facility].  The patient was brought to the operating room, placed in  the supine position on the operating room table.  Following administration  of general anesthesia, the patient is placed in lithotomy and the perineum  was prepped and draped in the usual strict aseptic fashion.  After draping  the patient, the lesion is obviously visible protruding through the anal  orifice.  She has circumferential skin tags.  On eversion there are  circumferential grade II to III internal hemorrhoids.  These were quite  prominent.  With general anesthesia, the patient has loss of muscular tone  in the pelvic floor and has a large degree of prolapse.  The polypoid lesion  in the midline anteriorly just to the right of center was grasped with an  Allis clamp.  A 3-0 chromic gut suture was placed at the apex above the  dentate line.  Using the electrocautery,  the polyp was excised completely  and submitted to pathology.  Mucosal defect is then closed with running 3-0  chromic gut locking suture.  Local field block is placed with Marcaine with  epinephrine.  Good hemostasis is noted.   The patient had a dry gauze dressing applied to the perineum.  She was taken  out of the lithotomy and transported to the recovery room in stable  condition.  The patient tolerated the procedure well.      Velora Heckler, MD  Electronically Signed     TMG/MEDQ  D:  02/02/2006  T:  02/03/2006  Job:  [id number]   cc:   Candyce Churn, M.D.  Fax: [phone]   Petra Kuba, M.D.  Fax: [phone]

## 2011-02-20 NOTE — H&P (Signed)
NAMECYANI, [name] NO.:  [id number]   MEDICAL RECORD NO.:  [id number]          PATIENT TYPE:  OIB   LOCATION:  1963                         FACILITY:  [facility]   PHYSICIAN:  Lyn Records, M.D.   DATE OF BIRTH:  12/11/1940   DATE OF ADMISSION:  01/05/2006  DATE OF DISCHARGE:                                HISTORY & PHYSICAL   Ms. [name] is a 70 year old female with no known history of coronary artery  disease who has recently over the past several months been experiencing  palpitations and fatigue.  Her symptoms occur with little exertion.  For  example, she has the symptoms even walking up stairs.  She denies any other  constitutional symptoms including chest pain or syncope.  She was referred  by Dr. Kevan Ny for an adenosine Cardiolite on December 16, 2005 and results  revealed a small region of inferior lateral ischemia, EF 64%.  She has now  been referred for cardiac catheterization.   ALLERGIES:  LODINE, SULFA, and CODEINE.  She states that drugs that dilate  her pupils cause angioedema, but no systemic reaction.   MEDICATIONS:  1.  Glucophage 500 mg one p.o. b.i.d. (held for 24 hours).  2.  Glyburide 5 mg b.i.d.  3.  Lantus 40 units subcutaneous q.h.s.  4.  Advair 100/50 one inhalation b.i.d.  5.  Albuterol as needed.  6.  Singulair 10 mg a day.  7.  Baby aspirin daily.  8.  Pepcid a.c. b.i.d.  9.  Humulin insulin sliding scale p.r.n.  10. Celexa 20 mg a day.  11. Diovan/HCTZ 160/12.5 mg daily.   SOCIAL HISTORY:  No tobacco, alcohol, or illicit drug use.  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   FAMILY HISTORY:  Her mom had diabetes.  Her father had cardiomegaly.   PAST MEDICAL HISTORY:  1.  Diabetes mellitus.  2.  Depression.  3.  Diverticulosis.  4.  Diverticulitis.  5.  Insomnia.  6.  Status post cataract surgery in 1993 and 1995.  7.  She underwent a partial hysterectomy.  8.   She has also had a right breast lumpectomy x3 secondary to benign      lesions.  9.  She has also had a bladder tack.   PHYSICAL EXAMINATION:  VITAL SIGNS:  Pulse 77, respirations 20, blood  pressure 127/67, O2 saturations 98% on room air.  GENERAL:  She is in no acute distress.  HEENT:  Grossly normal.  No carotid or subclavian bruits.  No JVD or  thyromegaly.  Sclerae clear.  Conjunctivae normal.  Nares without drainage.  CHEST:  Mild expiratory wheezing, otherwise clear.  HEART:  Regular rate and rhythm.  No gross murmur, rub, or ectopy.  ABDOMEN:  Obese.  Good bowel sounds.  Nontender, nondistended.  No mass.  No  bruits.  She does have a periumbilical hernia that is reducible and has  been there for years.  EXTREMITIES:  Palpable PT/DP pulses bilaterally.  She has no peripheral  edema.  She does not have any femoral bruits.  NEUROLOGIC:  Cranial nerves II-XII grossly intact.  SKIN:  Warm and dry.   LABORATORIES:  Glucose 211, BUN 13, creatinine 0.7, sodium 134, potassium  4.6, chloride 97, CO2 31.  Hemoglobin 13.3, hematocrit 40.8, platelets 273,  white count 8.1.  PT 11.9, INR 1, PTT 30.   An EKG shows normal sinus rhythm, rate around 70, T-wave inversion at aVL,  otherwise normal.   ASSESSMENT/PLAN:  1.  Palpitations.  2.  Fatigue.  3.  Abnormal Cardiolite.  4.  Diabetes mellitus.  5.  Depression.  6.  Diverticulosis with a history of diverticulitis.  7.  Multiple medication uses.  8.  Multiple surgeries as listed above.   The patient has arrived today at the [facility] outpatient catheterization  laboratory for cardiac catheterization by Dr. Katrinka Blazing.  He has seen and  examined the patient.  The risks and benefits have been discussed with the  patient including small risks and complications including stroke, MI, death,  right groin ecchymosis.  The patient agrees to proceed with the test with  Dr. Katrinka Blazing.      Guy Franco, P.A.      Lyn Records, M.D.   Electronically Signed    LB/MEDQ  D:  01/05/2006  T:  01/05/2006  Job:  [id number]   cc:   Candyce Churn, M.D.  Fax: [phone]

## 2011-02-20 NOTE — Op Note (Signed)
NAMEYAELI, HARTUNG                    ACCOUNT NO.:  [id number]   MEDICAL RECORD NO.:  [id number]          PATIENT TYPE:  AMB   LOCATION:  DAY                          [facility]AC[facility]  [facility]   PHYSICIAN:  Timothy E. Earlene Plater, M.D. DATE OF BIRTH:  05-31-1941   DATE OF PROCEDURE:  03/08/2006  DATE OF DISCHARGE:                                 OPERATIVE REPORT   PREOPERATIVE DIAGNOSIS:  Hemorrhoids with mucosal prolapse syndrome.   POSTOPERATIVE DIAGNOSIS:  Hemorrhoids with mucosal prolapse syndrome.   PROCEDURE:  PPH (procedure for prolapsing hemorrhoids) hemorrhoidectomy.   SURGEON:  Timothy E. Earlene Plater, M.D.   ANESTHESIA:  General.   Ms. Schwarz is 43.  Has a number of medical problems, currently under control or  pending control by she and Dr. Kevan Ny, including obesity, hypertension,  diabetes.  Colonoscopy was essentially negative.  She had one large polyp  removed from the anus and continued to have symptoms with bleeding,  prolapse, and moisture.  Because of that, she is referred for this surgery.  She has been carefully advised.  She was seen, identified, and the permit  signed.   Patient was taken to the operating room, placed supine.  General LMA  anesthesia provided.  She was then carefully placed in the lithotomy  position.  Perianal area inspected, prepped and draped in the usual fashion.  Mucosal prolapse syndrome was obvious with prolapse of the distal rectal  mucosa, almost 360 degrees.  This was reduced and inspected.  Soft and solid  stool removed.  The rectal mucosa also prepped with Betadine.  Marcaine  0.25% with epinephrine was mixed 1 cc to 30 cc and injected round and about  the anal orifice and perineum for prolonged anesthesia.  The operating  anoscope was then placed.  Beginning on the anterior mucosa approximately 3  cm proximal to the dentate line, a purse-string suture of 2-0 Prolene was  carefully placed.  It was checked and was felt to be in good position.  The  operating anoscope removed.  The clear plastic anoscope of the Ethicon kit  was then introduced.  The strings were brought through the tip of that kit,  and this was held snugly in place.  The stapling device was fully opened,  inserted through the clear plastic Ethicon anoscope, and it popped through  the purse-string suture line.  The suture line was then snugged up and tied  around the post of the stapling device, and strings were brought out along  the sides of the stapling device and then held snugly.  With the proper  orientation and pressure, the stapling device was then gently closed and  held for 60 seconds, fired, and then held for 60 seconds, and then opened  and removed.  A generous portion of rectal mucosa was enclosed in the  stapling device and seemed to be appropriate.  The perianal area was checked  carefully over a time of 8 minutes, including multiple anoscopies.  There  were no complication or bleeding of the suture line.  There was one small  tear in  the distal anal mucosa that  was suture-ligated.  The suture line was between 0.5 and 1 cm above the  dentate line but I think will be satisfactory.  Again, no bleeding or  complications.  Counts correct.  A packing of Surgicel and Gelfoam placed in  the rectal canal.  The patient given full instructions for care and will be  seen and followed as an outpatient.      Timothy E. Earlene Plater, M.D.  Electronically Signed     TED/MEDQ  D:  03/08/2006  T:  03/08/2006  Job:  [id number]   cc:   Candyce Churn, M.D.  Fax: [phone]   Velora Heckler, MD  [phone] [place]  [place] [address]

## 2011-02-20 NOTE — Cardiovascular Report (Signed)
[name], [name] NO.:  [id number]   MEDICAL RECORD NO.:  [id number]          PATIENT TYPE:  OIB   LOCATION:  1963                         FACILITY:  [facility]   PHYSICIAN:  Lyn Records, M.D.   DATE OF BIRTH:  02-Dec-1940   DATE OF PROCEDURE:  01/05/2006  DATE OF DISCHARGE:  01/05/2006                              CARDIAC CATHETERIZATION   INDICATION:  The patient has longstanding diabetes and a prior but recent  Cardiolite study suggesting the possibility of a reversible lateral wall  defect.  This study is being done to rule out obstructive coronary disease  in a patient who is preoperative for a surgical procedure to remove a large  rectal polyp or mass.   PROCEDURES PERFORMED:  1.  Left heart cath.  2.  Selective coronary angiography.  3.  Left ventriculography.   DESCRIPTION:  After informed consent, a 4-French sheath was placed in the  right femoral artery using the modified Seldinger technique.  A 4-French A2  multipurpose catheter was used for hemodynamic recordings, left  ventriculography by hand injection, and selective right coronary  angiography.  A number4-French left Judkins catheter was used for left  coronary angiography.  The patient tolerated the procedure without  complications.   RESULTS:  1.  Aortic pressure 127/64.      1.  Left ventricular pressure 128/14.  2.  Left ventriculography:  The LV cavity size is normal, the LV function is      normal, EF is greater than 50%.  3.  Coronary angiography.      1.  Left main coronary:  Left main is normal.      2.  Left anterior descending coronary:  The LAD is a large vessel that          wraps around the left ventricular apex.  It gives origin to a large          first diagonal and two smaller mid and distal diagonals.  This          vessel was normal.      3.  Circumflex artery:  Circumflex coronary artery gives origin to four          marginal branches.  The circumflex system is  normal.      4.  Right coronary:  The right coronary artery is large and free of          significant obstruction.  The vessel is dominant giving to PDA and          two left ventricular branches.   CONCLUSIONS:  1.  Normal coronary arteries.  2.  Normal left ventricular function.  3.  False positive Cardiolite study, possibly related to diaphragm      attenuation.   PLAN:  No further cardiac evaluation from a cardiac standpoint.  The patient  is cleared for general anesthesia and surgery.      Lyn Records, M.D.  Electronically Signed     HWS/MEDQ  D:  01/05/2006  T:  01/05/2006  Job:  [id number]  cc:   Candyce Churn, M.D.  Fax: [phone]

## 2011-02-20 NOTE — Discharge Summary (Signed)
[name], [name]                              ACCOUNT NO.:  [id number]   MEDICAL RECORD NO.:  [id number]                   PATIENT TYPE:  INP   LOCATION:  4711                                 FACILITY:  [facility]   PHYSICIAN:  Lilla Shook, M.D.            DATE OF BIRTH:  [birth year]   DATE OF ADMISSION:  08/31/2002  DATE OF DISCHARGE:  09/04/2002                                 DISCHARGE SUMMARY   DISCHARGE DIAGNOSES:  1. Community-acquired pneumonia.  2. Diabetes mellitus, type 2.  3. Asthma.  4. Atypical chest pain.  5. Hypertension.  6. Transient hyponatremia.  7. Anxiety disorder.   DISCHARGE MEDICATIONS:  1. Glucophage 500 mg three times daily.  2. Lexapro 10 mg twice daily.  3. NPH insulin 20 units twice daily.  4. Glynase 5 mg daily.  5. Altace 5 mg daily.  6. Advair 100/50 twice daily.  7. Albuterol metered dose inhaler p.r.n.  8. Ultracet p.r.n.  9. Zithromax 500 mg for three more days.  10.      Cefuroxime 500 mg twice daily.   HISTORY OF PRESENT ILLNESS:  (Please see thorough assessment of the history  of present illness.)  Briefly, this is a 70 year old woman with significant  past history of uncontrolled diabetes mellitus and asthma who was admitted  with pneumonia.  She was in her usual state of health and then developed  nausea, vomiting, shortness of breath and wheezing which were over his  baseline.  She had become more bed bound in the days prior to admission.  She was found to have right upper lobe infiltrate on x-ray and white blood  cell count of 23,000.   HOSPITAL COURSE:  [name] was admitted and treated for community-  acquired pneumonia with intravenous Rocephin and Zithromax.  Also she was  treated for her asthmatic wheezes with oxygen therapy, Solu-Medrol and beta  agonist.  She developed some tachycardia with about ten beats of  interventricular tachycardia which resolved off the bronchodilators.  She  had had an episode of chest  pain which was assessed to be noncardiac as it  was reproducible on examination.  Vicodin was helpful for this pain.  In  terms of her blood sugars, her diabetes was controlled with a sliding scale  insulin initially and then her outpatient medications were started after  stopping her intravenous steroids.  She was supposed to start Lantus insulin  at home but has remained on her twice a day NPH therapy which was continued  in the hospital.  She defervesced to normal and her blood pressure remained  within the normal range on her medications.  At discharge, she had decreased  wheezes, was able to ambulate with less fatigue and her cough remained  productive.  She did well on oral antibiotics and oral taper of steroids as  well as her Advair.   DISCHARGE CONDITION:  Stable and improved.   DISCHARGE INSTRUCTIONS:  [name] was to follow up with me in the office  within one week of discharge.  She was supposed to call the office if she  had more fevers, chills or shortness of breath.  She was to follow a low  salt, low-sugar diabetic diet and use nonsteroidal anti-inflammatory  medications for any chest pains.                                               Lilla Shook, M.D.    SEJ/MEDQ  D:  11/23/2002  T:  11/23/2002  Job:  [id number]

## 2011-02-20 NOTE — Op Note (Signed)
NAMEVENOLA, [name]                              ACCOUNT NO.:  [id number]   MEDICAL RECORD NO.:  [id number]                   PATIENT TYPE:  AMB   LOCATION:  ENDO                                 FACILITY:  [facility]   PHYSICIAN:  Petra Kuba, M.D.                 DATE OF BIRTH:  [birth year]   DATE OF PROCEDURE:  09/05/2003  DATE OF DISCHARGE:                                 OPERATIVE REPORT   PROCEDURE:  Colonoscopy.   INDICATIONS FOR PROCEDURE:  Family history of colon cancer, due for colonic  screening.   Consent was signed after risks, benefits, methods, and options were  thoroughly discussed multiple times in the past.   MEDICINES USED:  Demerol 50, Versed 5.   DESCRIPTION OF PROCEDURE:  Rectal inspection was pertinent for external  hemorrhoids significant. Digital exam was negative. The pediatric video  adjustable colonoscope was inserted, fairly easily advanced around the colon  to the cecum. This did require some abdominal pressure but no position  changes. Other than some left greater then right diverticula, no  abnormalities were seen.  The cecum was identified by the appendiceal  orifice and the ileocecal valve. The prep was fair. There was some stool  adherent to the wall of the colon worse on the right than the left which  could not be washed off. Lots of washing and suctioning was done in the rest  of the colon to obtain as adequate as possible visualization. However on  slow withdrawal through the colon other than the left greater than right  diverticula, no abnormalities were seen. Anal rectal pull through and  retroflexion confirmed the hemorrhoids external greater than internal. The  scope was reinserted a short ways up the left side of the colon, air was  suctioned, scope removed. The patient tolerated the procedure well. There  was no obvious or immediate complication.   ENDOSCOPIC DIAGNOSIS:  1. External greater than internal hemorrhoids.  2. Left greater than  right diverticula.  3. Otherwise within normal limits to the cecum.   PLAN:  Recheck colon screening in five years with a better prep. Maybe three  Fleets bottles or mag citrate GoLYTELY or other options at that junction.  Happy to see back p.r.n., otherwise return care to Dr. Lovell Sheehan for the  customary health care maintenance to include yearly rectals and guaiacs.                                               Petra Kuba, M.D.    MEM/MEDQ  D:  09/05/2003  T:  09/05/2003  Job:  [id number]   cc:   Lilla Shook, M.D.  [address], [address]  [place]  [place] [address]  Fax: [phone]

## 2011-02-20 NOTE — H&P (Signed)
NAMEARIONNE, IAMS                              ACCOUNT NO.:  [id number]   MEDICAL RECORD NO.:  [id number]                   PATIENT TYPE:  INP   LOCATION:  4711                                 FACILITY:  [facility]   PHYSICIAN:  Lilla Shook, M.D.            DATE OF BIRTH:  19-Jul-1941   DATE OF ADMISSION:  08/31/2002  DATE OF DISCHARGE:                                HISTORY & PHYSICAL   CHIEF COMPLAINT:  Nausea, vomiting, chest pain and cough.   HISTORY OF PRESENT ILLNESS:  [age]-year-old female with a past medical  history significant for diabetes mellitus, asthma and anxiety disorders,  admitted with pneumonia.  She was in her usual state of health until  yesterday evening, when she developed chills, nausea and vomiting.  She may  have also had some worsening shortness of breath and wheezing on her  baseline asthma status, but this is a little unclear to the patient and her  family.  She presented to the walk-in clinic with these complaints as well  as complaints of chest pain and back pain and cough.  She was found to have  an elevated blood sugar in the 300s at the walk-in clinic and was sent to  the emergency department.  In the emergency department, the patient was  found to have a white blood count of 23,000 with a left shift, as well as a  blood sugar of 293 with a normal bicarb level.  Chest x-ray showed a right  upper lobe infiltrate worrisome for pneumonia, although other possible  etiologies mentioned were a mass or infarction.  She had a mild decreased O2  saturation in the upper 80s, apparently, according to the family, and in the  emergency department, she receive morphine for pain as well as Rocephin and  Zithromax.  She was admitted for further care.   PAST MEDICAL HISTORY:  1. Diabetes mellitus.  2. Asthma.  3. Anxiety disorder.  4. Rectocele.  5. Cystocele.  6. Left breast lumpectomy as well.   CURRENT MEDICATIONS:  1. Glucophage 500 mg t.i.d.  2.  Lexapro -- dose unknown -- every day.  3. NPH insulin 20 units b.i.d.  4. Glynase every day -- dose unknown.  5. Altace 5 mg every day.  6. Advair 50/100 mcg b.i.d.  7. Albuterol as needed.  8. Ultracet as needed.   ALLERGIES:  Allergies to VOLTAREN, which in the past has caused severe  allergic reactions, and CODEINE, which, according to the patient, provides  mild GI upset.   FAMILY HISTORY:  1. Brother died at age 29 secondary to colon cancer.  2. Mother, brother and daughters all have diabetes mellitus.   SURGICAL HISTORY:  1. Hysterectomy.  2. Lumpectomy, left breast.  3. Rectocele repair.  4. Cystocele repair.   SOCIAL HISTORY:  Married, four children.  No tobacco.  No alcohol.   REVIEW OF SYSTEMS:  Review of systems pertinent for chronic anxiety, chronic  wheezing and shortness of breath secondary to her asthma.  The patient  denies chronic headaches, fevers, chills, night sweats, cough, sore throat,  chest pain, abdominal pain, diarrhea, blood in her stool or urine,  generalized weakness or depression.   PHYSICAL EXAMINATION:  VITAL SIGNS:  Blood pressure 115-147/49-62,  respirations 16, saturations 88% to 94%, T-max 102.0, pulse 100.  CBG 394.  GENERAL APPEARANCE:  A well-developed, overweight female in no acute  distress, alert and oriented.  HEENT:  Pupils are equal and responsive, reactive to light.  Nares clear.  Oral cavity/oropharynx:  Mild dry oral mucous membranes, otherwise clear.  NECK:  Neck supple.  No increased jugular venous distention.  No adenopathy.  No carotid bruits.  No thyromegaly.  CARDIOVASCULAR:  Borderline tachycardic with a regular rhythm, S1 and S2;  without murmur, rub or gallop appreciated.  LUNGS:  Lungs shows some mild increased breath sounds at the right upper  lobe, no active wheezing appreciated, good air movement, no rales.  ABDOMEN:  Bowel sounds present.  Soft, nontender and nondistended.  No  hepatosplenomegaly.  No palpable  masses.  MUSCULOSKELETAL:  There was reproducible anterior chest wall pain on  palpation.  RECTAL:  Deferred.  EXTREMITIES:  No clubbing, cyanosis, or edema.  Peripheral pulses 2+.  NEUROLOGIC:  Grossly intact.   LABORATORY AND ACCESSORY CLINICAL DATA:  AST 15, ALT 19, alkaline  phosphatase 71, bilirubin 0.7.  Hemoglobin 13.7, white blood cell count  23.0, platelets 274,000; 89% segs.  Sodium 130, potassium 3.5, chloride 97,  bicarb 25, BUN 10, creatinine 0.9, glucose 393, calcium 8.3.  Air blood gas:  PCO2 of 45.6, bicarb 28, pH 7.40.   Chest x-ray:  Right upper lobe infiltrate.   EKG showed normal sinus rhythm without acute ST-T wave changes.   IMPRESSION AND PLAN:  [age]-year-old female with diabetes mellitus and  asthma as well as nausea, vomiting and hyperglycemia and community-acquired  pneumonia.   Plan will be to:  1. Infectious disease:  She will be placed on Rocephin and Zithromax.  Blood     cultures will be obtained.  She will have a followup chest x-ray in the     morning.  2. Pulmonary:  The patient will be maintained on 2 to 4 L O2 nasal cannula     as needed.  She will receive her Advair b.i.d. and albuterol as needed.     She will be placed on intravenous steroids or a p.o. prednisone taper if     her asthma worsens.  She will have a followup chest x-ray in the morning.  3. Diabetes mellitus:  She will be maintained on a sliding-scale insulin     until her p.o. intake is better.  4. She will receive intravenous fluid hydration.  5.     She will receive morphine for musculoskeletal pain in the chest wall area as     well as in the back.  6. She will have deep venous thrombosis prophylaxis.  7. Her home medications in regards to her dosages will need to be clarified     in the morning.     Armstead Peaks, M.D.                         Lilla Shook, M.D.   BJ/MEDQ  D:  08/31/2002  T:  09/01/2002  Job:  [id number]

## 2011-02-20 NOTE — Procedures (Signed)
[place]  Patient:    [name]                              MRN: [id number] Proc. Date: 09/15/99 Adm. Date:  [id number] Attending:  Nelda Marseille CC:         Petra Kuba, M.D.                           Procedure Report  PROCEDURE:  Colonoscopy.  INDICATIONS:  Family history of colon cancer, personal history of colon polyps, due for repeat screening.  Consent was signed after risks, benefits, methods and options thoroughly discussed in the office.  MEDICATIONS:  Demerol 100, Versed 10.  PROCEDURE:  Rectal inspection pertinent for external hemorrhoids.  Digital examination was negative.  The video pediatric colonoscope was inserted and easily advanced to the ascending colon.  Abdominal pressure was applied and we were easily able to advance in the cecum, which did require any position changes.  Cecum was identified by the appendiceal orifice and ileocecal valve.  In fact, the scope  inserted a short ways in the terminal ileum, which was normal.  Photo documentation was obtained.  The scope was then slowly withdrawal.  The prep was adequate. There was some liquid stool that required washing and suctioning.  On slow withdrawal  through the colon, the cecum, ascending and transverse were normal.  The scope s withdrawn and around the left side of the colon, there were some mild to moderate diverticuli on the left side, but no polypoid lesions, masses or other abnormalities seen.  Once back in the rectum, scope is retroflexed, revealing some internal hemorrhoids.  Scope was drained, air was withdrawn, the scope was removed. The patient tolerated the procedure well.  There were no obvious immediate complication.  ENDOSCOPIC DIAGNOSIS: 1. Internal and external hemorrhoids. 2. Left sided diverticuli. 3. Otherwise within normal limits to the terminal ileum.  PLAN:  Yearly rectals and guaiacs for primary care.  Since we did not have  that  information handy, will need to send them a copy of this report, probably recheck colonic screening in 4-5 years based on her strong family history, otherwise follow up in the office in 4-6 weeks to recheck symptoms, continue her Prilosec and Robinul and see if any further work-up plans are needed and will have her call s sooner p.r.n. DD:  09/15/99 TD:  09/15/99 Job: [id number]

## 2011-06-30 LAB — DIFFERENTIAL
Basophils Relative: 1
Eosinophils Absolute: 0.4
Monocytes Relative: 7
Neutro Abs: 3.9
Neutrophils Relative %: 55

## 2011-06-30 LAB — URINALYSIS, ROUTINE W REFLEX MICROSCOPIC
Glucose, UA: 250 — AB
Ketones, ur: NEGATIVE
Nitrite: NEGATIVE
Protein, ur: NEGATIVE
Urobilinogen, UA: 0.2

## 2011-06-30 LAB — CBC
MCHC: 34.6
MCV: 76.4 — ABNORMAL LOW
Platelets: 265
RBC: 5.22 — ABNORMAL HIGH
WBC: 7

## 2011-06-30 LAB — BASIC METABOLIC PANEL
Potassium: 3.9
Sodium: 135

## 2011-06-30 LAB — URINE MICROSCOPIC-ADD ON

## 2011-06-30 LAB — APTT: aPTT: 27

## 2012-08-10 ENCOUNTER — Other Ambulatory Visit: Payer: Self-pay | Admitting: Internal Medicine

## 2012-08-10 DIAGNOSIS — Z1231 Encounter for screening mammogram for malignant neoplasm of breast: Secondary | ICD-10-CM

## 2012-09-13 ENCOUNTER — Ambulatory Visit
Admission: RE | Admit: 2012-09-13 | Discharge: 2012-09-13 | Disposition: A | Discharge status: Home / Self Care | Payer: Medicare Other | Source: Ambulatory Visit | Attending: Internal Medicine | Admitting: Internal Medicine

## 2012-09-13 DIAGNOSIS — Z1231 Encounter for screening mammogram for malignant neoplasm of breast: Secondary | ICD-10-CM

## 2012-10-19 ENCOUNTER — Other Ambulatory Visit: Payer: Self-pay | Admitting: Geriatric Medicine

## 2012-10-19 DIAGNOSIS — G459 Transient cerebral ischemic attack, unspecified: Secondary | ICD-10-CM

## 2012-10-21 ENCOUNTER — Inpatient Hospital Stay
Admission: RE | Admit: 2012-10-21 | Discharge: 2012-10-21 | Payer: Medicare Other | Source: Ambulatory Visit | Attending: Geriatric Medicine | Admitting: Geriatric Medicine

## 2012-10-21 ENCOUNTER — Ambulatory Visit
Admission: RE | Admit: 2012-10-21 | Discharge: 2012-10-21 | Disposition: A | Discharge status: Home / Self Care | Payer: Medicare Other | Source: Ambulatory Visit | Attending: Geriatric Medicine | Admitting: Geriatric Medicine

## 2012-10-21 DIAGNOSIS — G459 Transient cerebral ischemic attack, unspecified: Secondary | ICD-10-CM

## 2012-10-21 MED ORDER — IOHEXOL 300 MG/ML  SOLN
75.0000 mL | Freq: Once | INTRAMUSCULAR | Status: AC | PRN
  Administered 2012-10-21: 75 mL via INTRAVENOUS

## 2012-10-24 ENCOUNTER — Other Ambulatory Visit: Payer: Medicare Other

## 2013-03-18 IMAGING — CT CT HEAD WO/W CM
2 of 3 series · 16 of 30 positions shown, 18 images · IV contrast (omnipaque)
Comparison: None.

CLINICAL DATA: 71-year-old female with confusion times 6 days.
Vertigo.  History of headache and blurred vision.  TIA.

CT HEAD WITHOUT AND WITH CONTRAST
TECHNIQUE: Contiguous axial images were obtained from the base of
the skull through the vertex without and with intravenous contrast.
Contrast: 75mL OMNIPAQUE IOHEXOL 300 MG/ML  SOLN

[Series 32: 3d filtered head w/o · axial · non-contrast · 0.49mm/px · z∈[+21,+132]mm · 8 of 28 slices shown, 10 images (1 of 2)]
[im 4/28  brain]
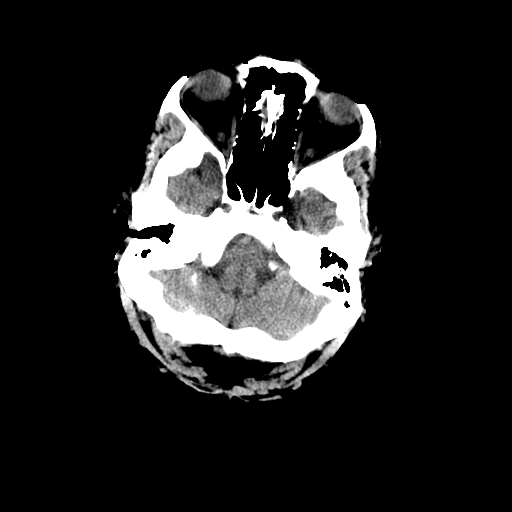
[im 4/28  bone]
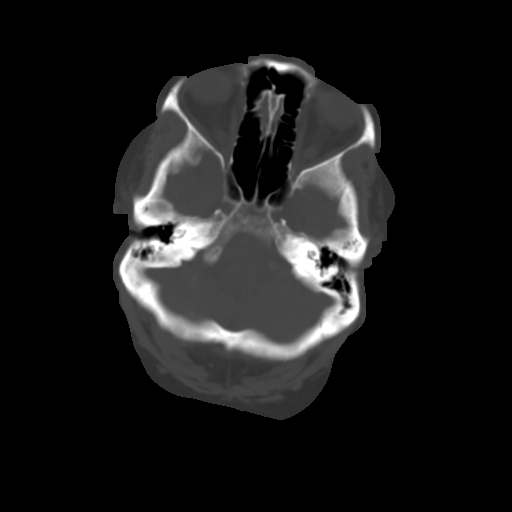
[im 7/28  brain]
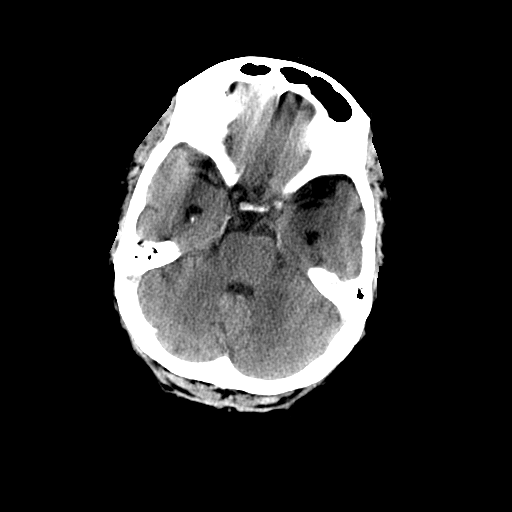
[im 10/28  brain]
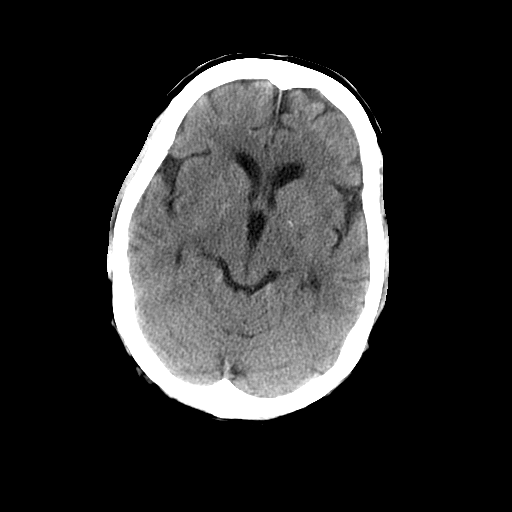
[im 13/28  brain]
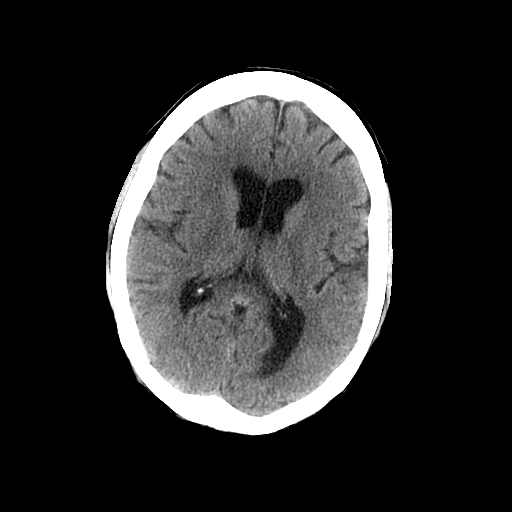
[im 16/28  brain]
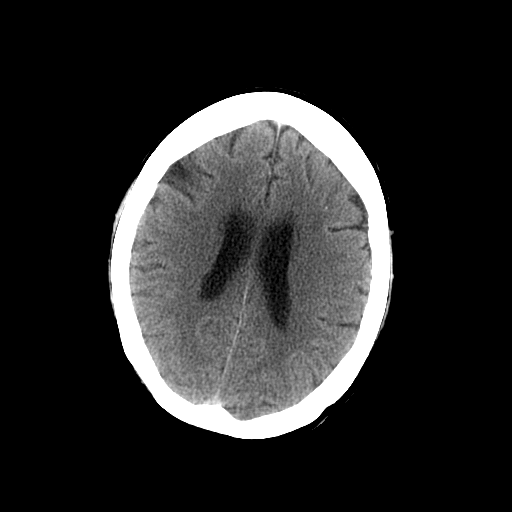
[im 16/28  bone]
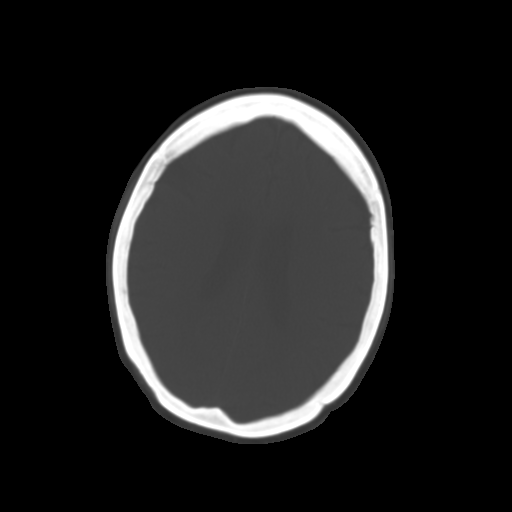
[im 19/28  brain]
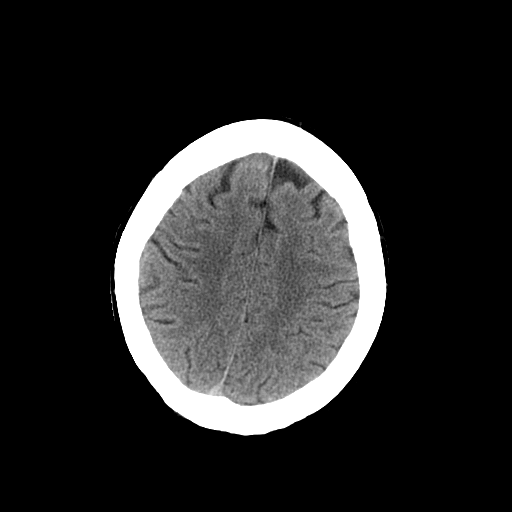
[im 22/28  brain]
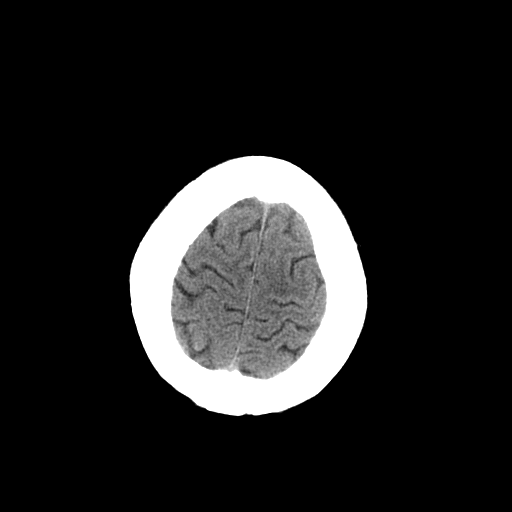
[im 25/28  brain]
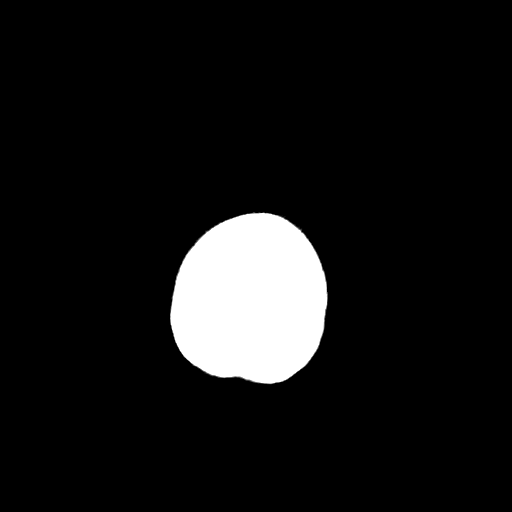

[Series 34: 3d filtered head w/o · axial · non-contrast · 0.49mm/px · z∈[+21,+132]mm · 8 of 28 slices shown (2 of 2)]
[im 4/28  brain]
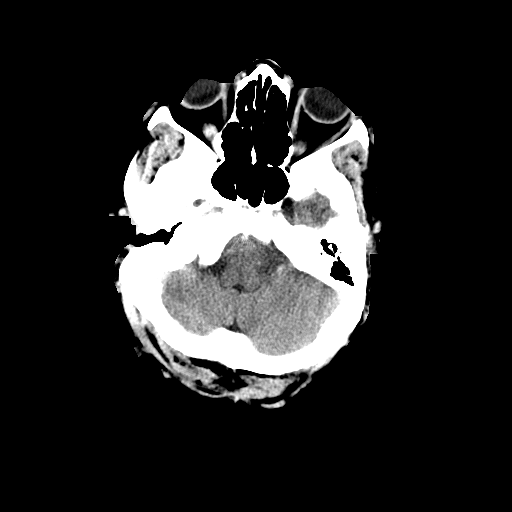
[im 7/28  brain]
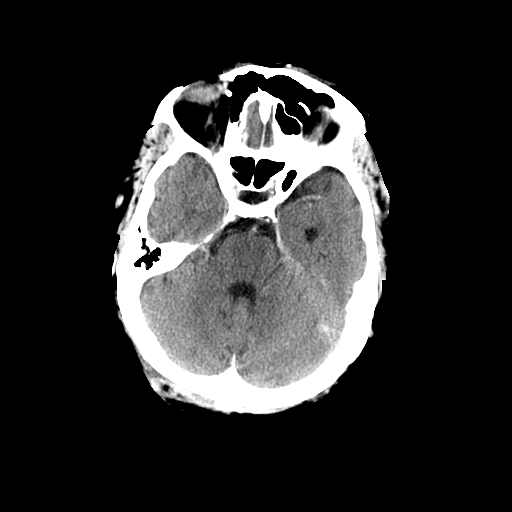
[im 10/28  brain]
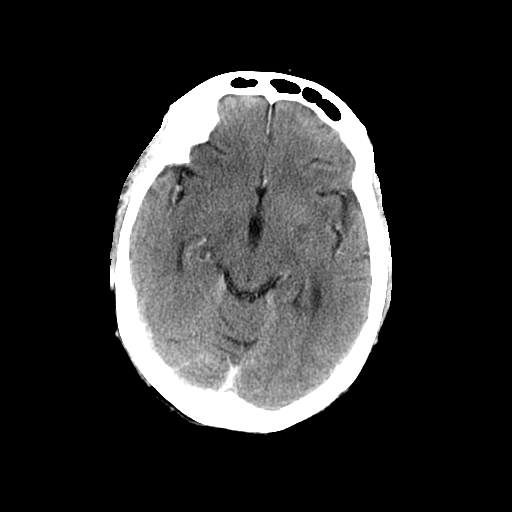
[im 13/28  brain]
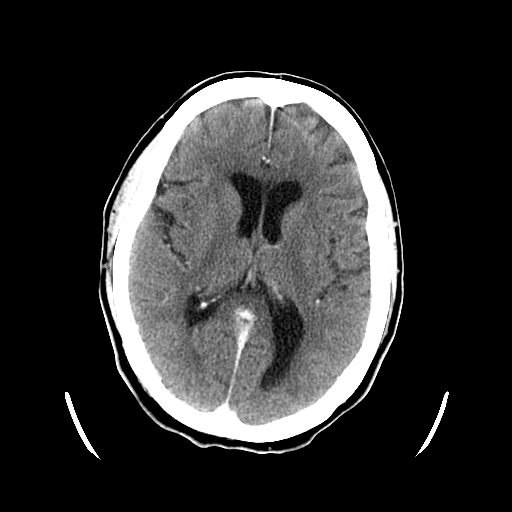
[im 16/28  brain]
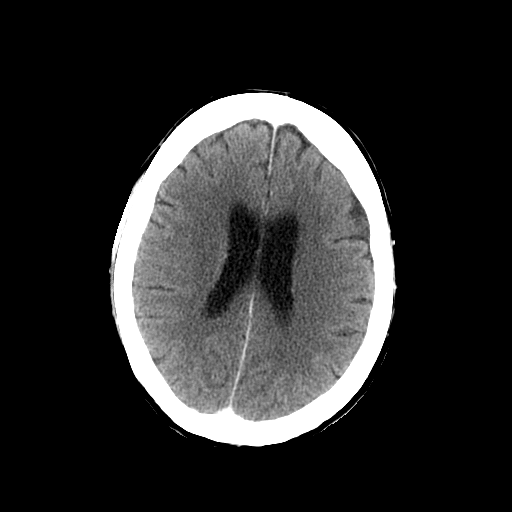
[im 19/28  brain]
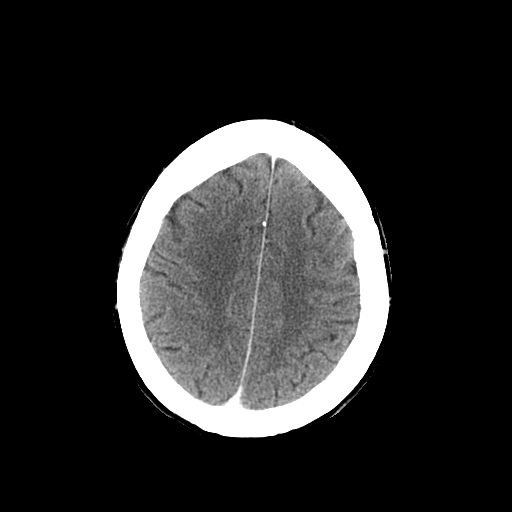
[im 22/28  brain]
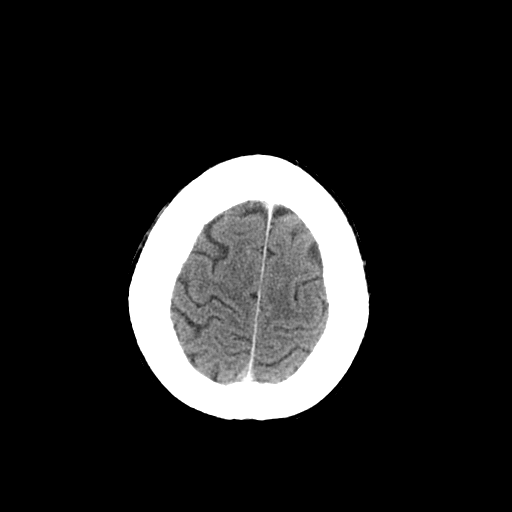
[im 25/28  brain]
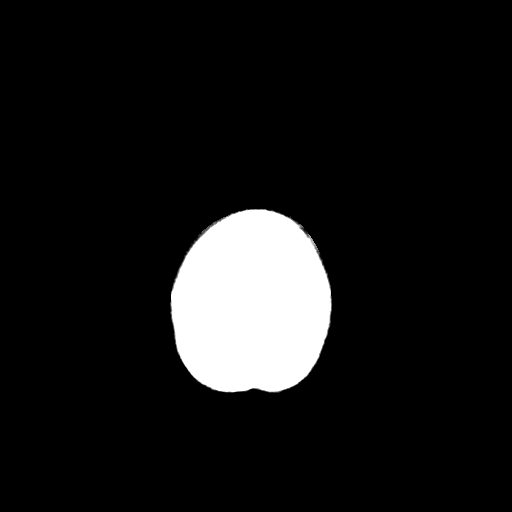

[16 of 30 positions shown; findings below may reference images not displayed]

FINDINGS: Mild right ethmoid sinus mucosal thickening.  Other
visualized paranasal sinuses and mastoids are clear.  No acute
osseous abnormality identified.  No acute orbit or scalp soft
tissue findings.

Calcified atherosclerosis at the skull base.  Cerebral volume is
within normal limits for age.  No ventriculomegaly. No midline
shift, mass effect, or evidence of mass lesion.  No acute
intracranial hemorrhage identified.  No evidence of cortically
based acute infarction identified.  There is evidence of a tiny
chronic lacunar infarct in the right cerebellar hemisphere (series
32 image 9).  Incidental small dilated perivascular space at the
inferior left basal ganglia. No abnormal enhancement identified.
IMPRESSION: No acute intracranial abnormality.  Evidence of mild chronic small
vessel disease.

## 2013-03-27 ENCOUNTER — Other Ambulatory Visit: Payer: Self-pay | Admitting: Ophthalmology

## 2013-04-05 LAB — WOUND CULTURE

## 2013-04-17 LAB — CULTURE, FUNGUS WITHOUT SMEAR

## 2013-06-19 ENCOUNTER — Ambulatory Visit: Payer: Medicare Other | Attending: Internal Medicine | Admitting: Physical Therapy

## 2013-06-19 DIAGNOSIS — IMO0001 Reserved for inherently not codable concepts without codable children: Secondary | ICD-10-CM | POA: Insufficient documentation

## 2013-06-19 DIAGNOSIS — Z9181 History of falling: Secondary | ICD-10-CM | POA: Insufficient documentation

## 2013-06-19 DIAGNOSIS — R269 Unspecified abnormalities of gait and mobility: Secondary | ICD-10-CM | POA: Insufficient documentation

## 2013-06-19 DIAGNOSIS — G609 Hereditary and idiopathic neuropathy, unspecified: Secondary | ICD-10-CM | POA: Insufficient documentation

## 2013-06-26 ENCOUNTER — Ambulatory Visit: Payer: Medicare Other | Admitting: Physical Therapy

## 2013-07-10 ENCOUNTER — Ambulatory Visit: Payer: Medicare Other | Admitting: Physical Therapy

## 2013-12-07 ENCOUNTER — Other Ambulatory Visit: Payer: Self-pay | Admitting: Internal Medicine

## 2013-12-07 ENCOUNTER — Ambulatory Visit
Admission: RE | Admit: 2013-12-07 | Discharge: 2013-12-07 | Disposition: A | Discharge status: Home / Self Care | Payer: Medicare Other | Source: Ambulatory Visit | Attending: Internal Medicine | Admitting: Internal Medicine

## 2013-12-07 DIAGNOSIS — K5900 Constipation, unspecified: Secondary | ICD-10-CM

## 2013-12-07 DIAGNOSIS — R0600 Dyspnea, unspecified: Secondary | ICD-10-CM

## 2014-04-23 ENCOUNTER — Other Ambulatory Visit: Payer: Self-pay | Admitting: Gastroenterology

## 2015-05-01 ENCOUNTER — Other Ambulatory Visit: Payer: Self-pay | Admitting: Internal Medicine

## 2015-05-01 ENCOUNTER — Ambulatory Visit
Admission: RE | Admit: 2015-05-01 | Discharge: 2015-05-01 | Disposition: A | Discharge status: Home / Self Care | Payer: Medicare Other | Source: Ambulatory Visit | Attending: Internal Medicine | Admitting: Internal Medicine

## 2015-05-01 DIAGNOSIS — R0789 Other chest pain: Secondary | ICD-10-CM

## 2015-05-01 DIAGNOSIS — S8002XA Contusion of left knee, initial encounter: Secondary | ICD-10-CM

## 2015-07-15 ENCOUNTER — Other Ambulatory Visit: Payer: Self-pay

## 2015-07-15 DIAGNOSIS — Z1231 Encounter for screening mammogram for malignant neoplasm of breast: Secondary | ICD-10-CM

## 2015-07-18 ENCOUNTER — Ambulatory Visit
Admission: RE | Admit: 2015-07-18 | Discharge: 2015-07-18 | Disposition: A | Discharge status: Home / Self Care | Payer: Medicare Other | Source: Ambulatory Visit

## 2015-07-18 DIAGNOSIS — Z1231 Encounter for screening mammogram for malignant neoplasm of breast: Secondary | ICD-10-CM

## 2015-07-22 ENCOUNTER — Other Ambulatory Visit: Payer: Self-pay | Admitting: Geriatric Medicine

## 2015-07-22 DIAGNOSIS — R928 Other abnormal and inconclusive findings on diagnostic imaging of breast: Secondary | ICD-10-CM

## 2015-07-25 ENCOUNTER — Other Ambulatory Visit: Payer: Self-pay | Admitting: Geriatric Medicine

## 2015-07-25 ENCOUNTER — Other Ambulatory Visit: Payer: Self-pay

## 2015-07-25 DIAGNOSIS — R928 Other abnormal and inconclusive findings on diagnostic imaging of breast: Secondary | ICD-10-CM

## 2015-07-29 ENCOUNTER — Other Ambulatory Visit: Payer: Medicare Other

## 2015-08-01 ENCOUNTER — Ambulatory Visit
Admission: RE | Admit: 2015-08-01 | Discharge: 2015-08-01 | Disposition: A | Discharge status: Home / Self Care | Payer: Medicare Other | Source: Ambulatory Visit | Attending: Geriatric Medicine | Admitting: Geriatric Medicine

## 2015-08-01 DIAGNOSIS — R928 Other abnormal and inconclusive findings on diagnostic imaging of breast: Secondary | ICD-10-CM

## 2015-09-28 ENCOUNTER — Inpatient Hospital Stay (HOSPITAL_COMMUNITY)
Admission: EM | Admit: 2015-09-28 | Discharge: 2015-10-05 | DRG: 193 | Disposition: A | Discharge status: Home Health | Payer: Medicare Other | Attending: Internal Medicine | Admitting: Internal Medicine

## 2015-09-28 ENCOUNTER — Observation Stay (HOSPITAL_COMMUNITY): Payer: Medicare Other

## 2015-09-28 ENCOUNTER — Emergency Department (HOSPITAL_COMMUNITY): Payer: Medicare Other

## 2015-09-28 ENCOUNTER — Encounter (HOSPITAL_COMMUNITY): Payer: Self-pay | Admitting: Emergency Medicine

## 2015-09-28 DIAGNOSIS — F419 Anxiety disorder, unspecified: Secondary | ICD-10-CM | POA: Diagnosis present

## 2015-09-28 DIAGNOSIS — N390 Urinary tract infection, site not specified: Secondary | ICD-10-CM | POA: Diagnosis present

## 2015-09-28 DIAGNOSIS — F329 Major depressive disorder, single episode, unspecified: Secondary | ICD-10-CM | POA: Diagnosis present

## 2015-09-28 DIAGNOSIS — R0600 Dyspnea, unspecified: Secondary | ICD-10-CM

## 2015-09-28 DIAGNOSIS — R0902 Hypoxemia: Secondary | ICD-10-CM | POA: Diagnosis not present

## 2015-09-28 DIAGNOSIS — R402242 Coma scale, best verbal response, confused conversation, at arrival to emergency department: Secondary | ICD-10-CM | POA: Diagnosis present

## 2015-09-28 DIAGNOSIS — M6289 Other specified disorders of muscle: Secondary | ICD-10-CM | POA: Diagnosis not present

## 2015-09-28 DIAGNOSIS — J45901 Unspecified asthma with (acute) exacerbation: Secondary | ICD-10-CM | POA: Diagnosis present

## 2015-09-28 DIAGNOSIS — T380X5A Adverse effect of glucocorticoids and synthetic analogues, initial encounter: Secondary | ICD-10-CM | POA: Diagnosis present

## 2015-09-28 DIAGNOSIS — G9341 Metabolic encephalopathy: Secondary | ICD-10-CM | POA: Diagnosis present

## 2015-09-28 DIAGNOSIS — I10 Essential (primary) hypertension: Secondary | ICD-10-CM | POA: Diagnosis present

## 2015-09-28 DIAGNOSIS — R29810 Facial weakness: Secondary | ICD-10-CM | POA: Diagnosis present

## 2015-09-28 DIAGNOSIS — Z8701 Personal history of pneumonia (recurrent): Secondary | ICD-10-CM

## 2015-09-28 DIAGNOSIS — E1165 Type 2 diabetes mellitus with hyperglycemia: Secondary | ICD-10-CM | POA: Diagnosis present

## 2015-09-28 DIAGNOSIS — J9601 Acute respiratory failure with hypoxia: Secondary | ICD-10-CM | POA: Diagnosis present

## 2015-09-28 DIAGNOSIS — R410 Disorientation, unspecified: Secondary | ICD-10-CM

## 2015-09-28 DIAGNOSIS — J45909 Unspecified asthma, uncomplicated: Secondary | ICD-10-CM | POA: Diagnosis present

## 2015-09-28 DIAGNOSIS — R402122 Coma scale, eyes open, to pain, at arrival to emergency department: Secondary | ICD-10-CM | POA: Diagnosis present

## 2015-09-28 DIAGNOSIS — J189 Pneumonia, unspecified organism: Principal | ICD-10-CM | POA: Diagnosis present

## 2015-09-28 DIAGNOSIS — E1142 Type 2 diabetes mellitus with diabetic polyneuropathy: Secondary | ICD-10-CM | POA: Diagnosis present

## 2015-09-28 DIAGNOSIS — Z794 Long term (current) use of insulin: Secondary | ICD-10-CM

## 2015-09-28 DIAGNOSIS — R062 Wheezing: Secondary | ICD-10-CM

## 2015-09-28 DIAGNOSIS — Z7984 Long term (current) use of oral hypoglycemic drugs: Secondary | ICD-10-CM

## 2015-09-28 DIAGNOSIS — R29898 Other symptoms and signs involving the musculoskeletal system: Secondary | ICD-10-CM | POA: Insufficient documentation

## 2015-09-28 DIAGNOSIS — Z90710 Acquired absence of both cervix and uterus: Secondary | ICD-10-CM

## 2015-09-28 DIAGNOSIS — IMO0001 Reserved for inherently not codable concepts without codable children: Secondary | ICD-10-CM | POA: Diagnosis present

## 2015-09-28 DIAGNOSIS — I4891 Unspecified atrial fibrillation: Secondary | ICD-10-CM | POA: Diagnosis not present

## 2015-09-28 DIAGNOSIS — R4182 Altered mental status, unspecified: Secondary | ICD-10-CM

## 2015-09-28 DIAGNOSIS — R402352 Coma scale, best motor response, localizes pain, at arrival to emergency department: Secondary | ICD-10-CM | POA: Diagnosis present

## 2015-09-28 DIAGNOSIS — R278 Other lack of coordination: Secondary | ICD-10-CM

## 2015-09-28 DIAGNOSIS — Z7951 Long term (current) use of inhaled steroids: Secondary | ICD-10-CM

## 2015-09-28 DIAGNOSIS — M5126 Other intervertebral disc displacement, lumbar region: Secondary | ICD-10-CM | POA: Diagnosis present

## 2015-09-28 DIAGNOSIS — Z66 Do not resuscitate: Secondary | ICD-10-CM | POA: Diagnosis present

## 2015-09-28 DIAGNOSIS — R531 Weakness: Secondary | ICD-10-CM | POA: Diagnosis present

## 2015-09-28 DIAGNOSIS — B9620 Unspecified Escherichia coli [E. coli] as the cause of diseases classified elsewhere: Secondary | ICD-10-CM | POA: Diagnosis present

## 2015-09-28 DIAGNOSIS — G629 Polyneuropathy, unspecified: Secondary | ICD-10-CM

## 2015-09-28 HISTORY — DX: Type 2 diabetes mellitus without complications: E11.9

## 2015-09-28 HISTORY — DX: Essential (primary) hypertension: I10

## 2015-09-28 LAB — I-STAT ARTERIAL BLOOD GAS, ED
Acid-Base Excess: 2 mmol/L (ref 0.0–2.0)
BICARBONATE: 27.7 meq/L — AB (ref 20.0–24.0)
O2 Saturation: 97 %
PCO2 ART: 48.1 mmHg — AB (ref 35.0–45.0)
PH ART: 7.368 (ref 7.350–7.450)
PO2 ART: 93 mmHg (ref 80.0–100.0)
Patient temperature: 98.7
TCO2: 29 mmol/L (ref 0–100)

## 2015-09-28 LAB — GLUCOSE, CAPILLARY: GLUCOSE-CAPILLARY: 165 mg/dL — AB (ref 65–99)

## 2015-09-28 LAB — COMPREHENSIVE METABOLIC PANEL
ALBUMIN: 3.5 g/dL (ref 3.5–5.0)
ALT: 19 U/L (ref 14–54)
AST: 16 U/L (ref 15–41)
Alkaline Phosphatase: 74 U/L (ref 38–126)
Anion gap: 11 (ref 5–15)
BILIRUBIN TOTAL: 0.6 mg/dL (ref 0.3–1.2)
BUN: 19 mg/dL (ref 6–20)
CHLORIDE: 101 mmol/L (ref 101–111)
CO2: 27 mmol/L (ref 22–32)
CREATININE: 1.13 mg/dL — AB (ref 0.44–1.00)
Calcium: 9.1 mg/dL (ref 8.9–10.3)
GFR calc Af Amer: 54 mL/min — ABNORMAL LOW (ref >60)
GFR, EST NON AFRICAN AMERICAN: 47 mL/min — AB (ref >60)
GLUCOSE: 215 mg/dL — AB (ref 65–99)
POTASSIUM: 4.2 mmol/L (ref 3.5–5.1)
Sodium: 139 mmol/L (ref 135–145)
TOTAL PROTEIN: 7.9 g/dL (ref 6.5–8.1)

## 2015-09-28 LAB — URINALYSIS, ROUTINE W REFLEX MICROSCOPIC
Bilirubin Urine: NEGATIVE
GLUCOSE, UA: NEGATIVE mg/dL
KETONES UR: NEGATIVE mg/dL
LEUKOCYTES UA: NEGATIVE
NITRITE: POSITIVE — AB
PH: 5.5 (ref 5.0–8.0)
Protein, ur: NEGATIVE mg/dL
SPECIFIC GRAVITY, URINE: 1.017 (ref 1.005–1.030)

## 2015-09-28 LAB — CBC WITH DIFFERENTIAL/PLATELET
BASOS ABS: 0 10*3/uL (ref 0.0–0.1)
Basophils Relative: 0 %
EOS PCT: 8 %
Eosinophils Absolute: 0.7 10*3/uL (ref 0.0–0.7)
HEMATOCRIT: 43 % (ref 36.0–46.0)
Hemoglobin: 13.1 g/dL (ref 12.0–15.0)
LYMPHS PCT: 19 %
Lymphs Abs: 1.6 10*3/uL (ref 0.7–4.0)
MCH: 25.5 pg — AB (ref 26.0–34.0)
MCHC: 30.5 g/dL (ref 30.0–36.0)
MCV: 83.7 fL (ref 78.0–100.0)
MONO ABS: 1.1 10*3/uL — AB (ref 0.1–1.0)
MONOS PCT: 13 %
NEUTROS ABS: 5.1 10*3/uL (ref 1.7–7.7)
Neutrophils Relative %: 60 %
PLATELETS: 187 10*3/uL (ref 150–400)
RBC: 5.14 MIL/uL — AB (ref 3.87–5.11)
RDW: 15.6 % — AB (ref 11.5–15.5)
WBC: 8.5 10*3/uL (ref 4.0–10.5)

## 2015-09-28 LAB — URINE MICROSCOPIC-ADD ON: RBC / HPF: NONE SEEN RBC/hpf (ref 0–5)

## 2015-09-28 LAB — SEDIMENTATION RATE: Sed Rate: 43 mm/hr — ABNORMAL HIGH (ref 0–22)

## 2015-09-28 LAB — PHOSPHORUS: PHOSPHORUS: 3.4 mg/dL (ref 2.5–4.6)

## 2015-09-28 LAB — INFLUENZA PANEL BY PCR (TYPE A & B)
H1N1 flu by pcr: NOT DETECTED
INFLAPCR: NEGATIVE
INFLBPCR: NEGATIVE

## 2015-09-28 LAB — PROCALCITONIN

## 2015-09-28 LAB — CK: CK TOTAL: 79 U/L (ref 38–234)

## 2015-09-28 LAB — MAGNESIUM: Magnesium: 1.5 mg/dL — ABNORMAL LOW (ref 1.7–2.4)

## 2015-09-28 LAB — I-STAT CG4 LACTIC ACID, ED: Lactic Acid, Venous: 1.03 mmol/L (ref 0.5–2.0)

## 2015-09-28 LAB — AMMONIA: Ammonia: 34 umol/L (ref 9–35)

## 2015-09-28 LAB — CBG MONITORING, ED: Glucose-Capillary: 191 mg/dL — ABNORMAL HIGH (ref 65–99)

## 2015-09-28 LAB — BRAIN NATRIURETIC PEPTIDE: B Natriuretic Peptide: 68.5 pg/mL (ref 0.0–100.0)

## 2015-09-28 LAB — LACTIC ACID, PLASMA: Lactic Acid, Venous: 0.6 mmol/L (ref 0.5–2.0)

## 2015-09-28 LAB — STREP PNEUMONIAE URINARY ANTIGEN: STREP PNEUMO URINARY ANTIGEN: NEGATIVE

## 2015-09-28 MED ORDER — INSULIN GLARGINE 100 UNIT/ML ~~LOC~~ SOLN
40.0000 [IU] | Freq: Every day | SUBCUTANEOUS | Status: DC
Start: 2015-09-28 — End: 2015-09-30
  Administered 2015-09-29 – 2015-09-30 (×2): 40 [IU] via SUBCUTANEOUS
  Filled 2015-09-28 (×3): qty 0.4

## 2015-09-28 MED ORDER — SODIUM CHLORIDE 0.9 % IV SOLN
INTRAVENOUS | Status: AC
  Administered 2015-09-28: 20:00:00 via INTRAVENOUS

## 2015-09-28 MED ORDER — ALBUTEROL SULFATE (2.5 MG/3ML) 0.083% IN NEBU: 2.5000 mg | INHALATION_SOLUTION | RESPIRATORY_TRACT | Status: DC | PRN

## 2015-09-28 MED ORDER — BUDESONIDE 0.25 MG/2ML IN SUSP
0.2500 mg | Freq: Two times a day (BID) | RESPIRATORY_TRACT | Status: DC
  Administered 2015-09-28 – 2015-10-05 (×14): 0.25 mg via RESPIRATORY_TRACT
  Filled 2015-09-28 (×14): qty 2

## 2015-09-28 MED ORDER — IPRATROPIUM-ALBUTEROL 0.5-2.5 (3) MG/3ML IN SOLN
3.0000 mL | Freq: Three times a day (TID) | RESPIRATORY_TRACT | Status: DC
  Administered 2015-09-29 – 2015-10-01 (×7): 3 mL via RESPIRATORY_TRACT
  Filled 2015-09-28 (×7): qty 3

## 2015-09-28 MED ORDER — LORATADINE 10 MG PO TABS
10.0000 mg | ORAL_TABLET | Freq: Every day | ORAL | Status: DC
  Administered 2015-09-29 – 2015-10-05 (×7): 10 mg via ORAL
  Filled 2015-09-28 (×7): qty 1

## 2015-09-28 MED ORDER — METHYLPREDNISOLONE SODIUM SUCC 125 MG IJ SOLR: 60.0000 mg | Freq: Three times a day (TID) | INTRAMUSCULAR | Status: DC

## 2015-09-28 MED ORDER — IPRATROPIUM-ALBUTEROL 0.5-2.5 (3) MG/3ML IN SOLN
3.0000 mL | Freq: Four times a day (QID) | RESPIRATORY_TRACT | Status: DC
  Administered 2015-09-28: 3 mL via RESPIRATORY_TRACT
  Filled 2015-09-28 (×2): qty 3

## 2015-09-28 MED ORDER — CEFTRIAXONE SODIUM 1 G IJ SOLR
1.0000 g | INTRAMUSCULAR | Status: DC
  Administered 2015-09-28 – 2015-10-03 (×6): 1 g via INTRAVENOUS
  Filled 2015-09-28 (×10): qty 10

## 2015-09-28 MED ORDER — FLUTICASONE PROPIONATE 50 MCG/ACT NA SUSP
2.0000 | Freq: Every day | NASAL | Status: DC
  Administered 2015-09-29 – 2015-10-05 (×7): 2 via NASAL
  Filled 2015-09-28 (×2): qty 16

## 2015-09-28 MED ORDER — DEXTROSE 5 % IV SOLN
500.0000 mg | INTRAVENOUS | Status: DC
  Administered 2015-09-28 – 2015-09-29 (×2): 500 mg via INTRAVENOUS
  Filled 2015-09-28 (×3): qty 500

## 2015-09-28 MED ORDER — DULOXETINE HCL 60 MG PO CPEP
60.0000 mg | ORAL_CAPSULE | Freq: Every day | ORAL | Status: DC
  Administered 2015-09-29 – 2015-10-05 (×7): 60 mg via ORAL
  Filled 2015-09-28 (×7): qty 1

## 2015-09-28 MED ORDER — ASPIRIN 325 MG PO TABS
325.0000 mg | ORAL_TABLET | Freq: Every day | ORAL | Status: DC
  Administered 2015-09-29 – 2015-10-04 (×6): 325 mg via ORAL
  Filled 2015-09-28 (×6): qty 1

## 2015-09-28 MED ORDER — ATORVASTATIN CALCIUM 10 MG PO TABS
10.0000 mg | ORAL_TABLET | Freq: Every day | ORAL | Status: DC
  Administered 2015-09-29 – 2015-10-05 (×7): 10 mg via ORAL
  Filled 2015-09-28 (×7): qty 1

## 2015-09-28 MED ORDER — METHYLPREDNISOLONE SODIUM SUCC 125 MG IJ SOLR
60.0000 mg | Freq: Three times a day (TID) | INTRAMUSCULAR | Status: DC
  Administered 2015-09-28 – 2015-10-04 (×18): 60 mg via INTRAVENOUS
  Filled 2015-09-28 (×18): qty 2

## 2015-09-28 MED ORDER — PIPERACILLIN-TAZOBACTAM 3.375 G IVPB 30 MIN: 3.3750 g | Freq: Once | INTRAVENOUS | Status: DC

## 2015-09-28 MED ORDER — LORAZEPAM 2 MG/ML IJ SOLN
0.5000 mg | Freq: Once | INTRAMUSCULAR | Status: AC
  Administered 2015-09-28: 0.5 mg via INTRAVENOUS
  Filled 2015-09-28: qty 1

## 2015-09-28 MED ORDER — GABAPENTIN 400 MG PO CAPS
400.0000 mg | ORAL_CAPSULE | Freq: Three times a day (TID) | ORAL | Status: DC
  Administered 2015-09-29 – 2015-10-05 (×18): 400 mg via ORAL
  Filled 2015-09-28 (×18): qty 1

## 2015-09-28 MED ORDER — INSULIN ASPART 100 UNIT/ML ~~LOC~~ SOLN
0.0000 [IU] | SUBCUTANEOUS | Status: DC
  Administered 2015-09-28: 3 [IU] via SUBCUTANEOUS
  Administered 2015-09-29: 15 [IU] via SUBCUTANEOUS
  Administered 2015-09-29: 3 [IU] via SUBCUTANEOUS
  Administered 2015-09-29: 5 [IU] via SUBCUTANEOUS
  Administered 2015-09-29: 15 [IU] via SUBCUTANEOUS
  Administered 2015-09-29 (×2): 5 [IU] via SUBCUTANEOUS
  Administered 2015-09-30: 15 [IU] via SUBCUTANEOUS
  Administered 2015-09-30: 5 [IU] via SUBCUTANEOUS
  Administered 2015-09-30 (×2): 15 [IU] via SUBCUTANEOUS
  Administered 2015-09-30: 3 [IU] via SUBCUTANEOUS
  Administered 2015-09-30: 5 [IU] via SUBCUTANEOUS
  Administered 2015-10-01: 3 [IU] via SUBCUTANEOUS
  Administered 2015-10-01: 8 [IU] via SUBCUTANEOUS
  Administered 2015-10-01: 5 [IU] via SUBCUTANEOUS
  Administered 2015-10-01: 2 [IU] via SUBCUTANEOUS
  Administered 2015-10-01: 3 [IU] via SUBCUTANEOUS
  Administered 2015-10-02: 11 [IU] via SUBCUTANEOUS
  Administered 2015-10-02: 15 [IU] via SUBCUTANEOUS
  Administered 2015-10-02 (×2): 5 [IU] via SUBCUTANEOUS
  Administered 2015-10-02: 11 [IU] via SUBCUTANEOUS
  Administered 2015-10-02: 5 [IU] via SUBCUTANEOUS
  Administered 2015-10-03 (×2): 8 [IU] via SUBCUTANEOUS
  Administered 2015-10-03: 15 [IU] via SUBCUTANEOUS
  Administered 2015-10-03: 5 [IU] via SUBCUTANEOUS
  Administered 2015-10-03: 11 [IU] via SUBCUTANEOUS
  Administered 2015-10-04: 8 [IU] via SUBCUTANEOUS
  Administered 2015-10-04: 3 [IU] via SUBCUTANEOUS
  Administered 2015-10-04: 5 [IU] via SUBCUTANEOUS
  Administered 2015-10-04: 11 [IU] via SUBCUTANEOUS
  Administered 2015-10-05: 5 [IU] via SUBCUTANEOUS

## 2015-09-28 MED ORDER — ASPIRIN 300 MG RE SUPP
300.0000 mg | Freq: Every day | RECTAL | Status: DC
  Administered 2015-09-28 – 2015-09-29 (×2): 300 mg via RECTAL
  Filled 2015-09-28: qty 1

## 2015-09-28 MED ORDER — ALPRAZOLAM 0.25 MG PO TABS
0.2500 mg | ORAL_TABLET | Freq: Two times a day (BID) | ORAL | Status: DC | PRN
  Administered 2015-09-30 – 2015-10-01 (×2): 0.25 mg via ORAL
  Filled 2015-09-28 (×2): qty 1

## 2015-09-28 MED ORDER — ASPIRIN 300 MG RE SUPP
300.0000 mg | Freq: Every day | RECTAL | Status: DC
  Filled 2015-09-28 (×2): qty 1

## 2015-09-28 MED ORDER — ARFORMOTEROL TARTRATE 15 MCG/2ML IN NEBU
15.0000 ug | INHALATION_SOLUTION | Freq: Two times a day (BID) | RESPIRATORY_TRACT | Status: DC
  Administered 2015-09-28 – 2015-10-05 (×12): 15 ug via RESPIRATORY_TRACT
  Filled 2015-09-28 (×19): qty 2

## 2015-09-28 MED ORDER — SODIUM CHLORIDE 0.9 % IV BOLUS (SEPSIS)
1000.0000 mL | Freq: Once | INTRAVENOUS | Status: AC
Start: 2015-09-28 — End: 2015-09-28
  Administered 2015-09-28: 1000 mL via INTRAVENOUS

## 2015-09-28 MED ORDER — ENOXAPARIN SODIUM 40 MG/0.4ML ~~LOC~~ SOLN
40.0000 mg | SUBCUTANEOUS | Status: DC
  Administered 2015-09-28 – 2015-10-03 (×6): 40 mg via SUBCUTANEOUS
  Filled 2015-09-28 (×6): qty 0.4

## 2015-09-28 MED ORDER — STROKE: EARLY STAGES OF RECOVERY BOOK
Freq: Once | Status: AC
  Administered 2015-09-28: 20:00:00
  Filled 2015-09-28: qty 1

## 2015-09-28 MED ORDER — MAGNESIUM SULFATE 2 GM/50ML IV SOLN
2.0000 g | Freq: Once | INTRAVENOUS | Status: AC
  Administered 2015-09-28: 2 g via INTRAVENOUS
  Filled 2015-09-28: qty 50

## 2015-09-28 NOTE — ED Notes (Signed)
Attempted report, no answer on 5C.

## 2015-09-28 NOTE — ED Notes (Signed)
pts son at bedside, unable to provide much information about patient at this time. pts son reports pt is from home with her husband and that patient was having trouble standing today. Dr. Clydene PughKnott made aware of patient's status at this time.

## 2015-09-28 NOTE — Progress Notes (Signed)
Received from MRI via ER stretcher; transferred to bed; patient incontinent of stool not urine; patient is alert and oriented; oriented to room and unit routine.  Safety measures explained; bed alarm set; family informed of droplet precautions and isolation policy and procedure for visiting at bedside.

## 2015-09-28 NOTE — ED Notes (Signed)
Patient's husband now at bedside, reports patient went to bed with a bad headache last night.

## 2015-09-28 NOTE — H&P (Signed)
[facility] History and Physical                                                                                    [name], is a 74 y.o. female  MRN: [id number]   DOB - 08/13/1941  Admit Date - 09/28/2015  Outpatient Primary MD for the patient is Romero BellingELLISON, SEAN, MD  Referring MD: Clydene PughKnott / ER  Consulting M.D: Cherylynn RidgesShikhman / Neurology  PMH: Past Medical History  Diagnosis Date  . Diabetes mellitus without complication (HCC)   . Hypertension       PSH: Past Surgical History  Procedure Laterality Date  . Abdominal hysterectomy       CC:  Chief Complaint  Patient presents with  . Weakness  . Altered Mental Status     HPI: 74 year old female patient with known diabetes on insulin with associated significant peripheral neuropathy, hypertension and asthma. Patient was in her usual state of health with her chronic nonproductive cough but was complaining of a headache last night before going to bed. The patient apparently rolled out of bed onto the floor this morning reporting that her "legs wouldn't work". EMS was called to the house by the family. Upon their evaluation the patient had room air saturations of 86% with wheezing in the upper airways and diminished in the lower airways. She was given 1 albuterol neb and placed on 4 L oxygen. CBG was 212. The patient was oriented to name only. No apparent focal neurological deficits were appreciated. Since that initial evaluation further history is been obtained. Patient has significant peripheral neuropathy and reports having chronic numbness in the hands and feet for many years. Patient reports chronic difficulties with changing from a seated or supine position to standing and at times has to sit back down or lay back down because of lower extremity weakness. She also reports chronic lower extremity weakness that is worse on the left but denies difficulty utilizing the left leg with ambulation. Husband and patient both state that when  she stands up and again" gets going" she is able to walk without difficulty. Later husband and explained to attending physician that earlier this morning her symptoms were somewhat different than baseline with new left arm weakness and decreased grip as well as worsened left leg weakness. This was associated with apparent left facial drooping. Denies history of recurrent or recent falls. Patient currently is denying worsening in cough or any productive cough. No fevers or chills at home. She has had some rhinorrhea. She has a history of pneumonia in the early winter typically. She did receive the influenza vaccine this year. She described her headache as being a sinus headache. No abdominal pain nausea or vomiting. She states no change in her chronic hand and feet numbness and dysmobility issues. After I left the room the husband pulled inside and stated that the patient is talking to her brother in asking him questions noting his brother has been deceased for 3 years.  ER Evaluation and treatment: CT noncontrast of head: No focal acute intracranial abnormality, chronic diffuse atrophy Portable chest x-ray: Low lung volumes with coarsening of interstitial markings which may represent developing  interstitial edema. Patchy airspace consolidation right lower lobe. Given patient's reported augmentation history aspiration pneumonia is of consideration. ABG unremarkable except for mild elevated PCO2 with normal pH and elevated bicarbonate 27.7 Mild elevation in creatinine 1.13 with normal BUN Glucose 215 Lactic acid 1.03 No leukocytosis and hemoglobin 13 with normal platelets Urinalysis abnormal with cloudy appearance, many bacteria, positive nitrite, 0-5 wbc's Blood culture and urine culture pending Normal saline bolus 1000 mL Zosyn 3.375 mg IV 1  Review of Systems   In addition to the HPI above,  No Fever-chills, myalgias or other constitutional symptoms No changes with Vision or hearing No problems  swallowing food or Liquids, indigestion/reflux No Chest pain, no change in chronic Cough, no Shortness of Breath, palpitations, orthopnea or DOE No Abdominal pain, N/V; no melena or hematochezia, no dark tarry stools No dysuria, hematuria or flank pain No new skin rashes, lesions, masses or bruises, No new joints pains-aches No recent weight gain or loss No polyuria, polydypsia or polyphagia,  *A full 10 point Review of Systems was done, except as stated above, all other Review of Systems were negative.  Social History Social History  Substance Use Topics  . Smoking status: Never Smoker   . Smokeless tobacco: Not on file  . Alcohol Use: No    Resides at: Private residence  Lives with: Spouse  Ambulatory status: Typically requires assistance when transitioning from seated or supine position   Family History No family history on file. discussed with patient and no family history that would be pertinent to current admission symptoms   Prior to Admission medications   Medication Sig Start Date End Date Taking? Authorizing Provider  ALPRAZolam (XANAX) 0.25 MG tablet Take 0.25 mg by mouth 2 (two) times daily as needed for anxiety or sleep.   Yes Historical Provider, MD  aspirin EC 81 MG tablet Take 81 mg by mouth daily.   Yes Historical Provider, MD  atorvastatin (LIPITOR) 10 MG tablet Take 10 mg by mouth daily.   Yes Historical Provider, MD  cetirizine (ZYRTEC) 10 MG tablet Take 10 mg by mouth daily as needed for allergies.   Yes Historical Provider, MD  DULoxetine (CYMBALTA) 60 MG capsule Take 60 mg by mouth daily. 09/18/15  Yes Historical Provider, MD  gabapentin (NEURONTIN) 400 MG capsule Take 400 mg by mouth 3 (three) times daily as needed. 07/27/15  Yes Historical Provider, MD  HYDROcodone-acetaminophen (NORCO/VICODIN) 5-325 MG tablet Take 1 tablet by mouth every 6 (six) hours as needed for moderate pain.   Yes Historical Provider, MD  Insulin Glargine (TOUJEO SOLOSTAR [place])  Inject 40 Units into the skin daily.   Yes Historical Provider, MD  losartan-hydrochlorothiazide (HYZAAR) 100-25 MG tablet Take 1 tablet by mouth daily. 09/04/15  Yes Historical Provider, MD  metFORMIN (GLUCOPHAGE) 500 MG tablet Take 1,000 mg by mouth 2 (two) times daily. 09/19/15  Yes Historical Provider, MD  PROAIR HFA 108 (90 BASE) MCG/ACT inhaler Inhale 2 puffs into the lungs every 4 (four) hours as needed for wheezing.  09/03/15  Yes Historical Provider, MD    Allergies  Allergen Reactions  . Etodolac Anaphylaxis    Physical Exam  Vitals  Blood pressure 126/56, pulse 92, temperature 98.2 F (36.8 C), temperature source Rectal, resp. rate 14, SpO2 98 %.   General:  In no acute distress, appears   Psych: Flat affect, Denies Suicidal or Homicidal ideations, Awake Alert, Oriented X self, place , time continues to have intermittent she will hallucinatons.  Neuro: CN  II through XII intact, Strength 5/5 all 4 extremities, Sensation intact all 4 extremities with noted chronic decreased sensation especially plantar surfaces of feet and subjective reports of decreased sensation fingertips. Difficulty performing finger to nose; due to lower extremity weakness unable to adequately perform heel to shin maneuver  ENT:  Ears and Eyes appear Normal, Conjunctivae clear, PER. Moist oral mucosa without erythema or exudates.  Neck:  Supple, No lymphadenopathy appreciated  Respiratory:  Symmetrical chest wall movement, is to air movement in the bases with expiratory wheezes, 4 liters  Cardiac:  RRR, No Murmurs, no LE edema noted, no JVD, No carotid bruits, peripheral pulses palpable at 2+  Abdomen:  Positive bowel sounds, Soft, Non tender, Non distended,  No masses appreciated, no obvious hepatosplenomegaly  Skin:  No Cyanosis, Normal Skin Turgor, No Skin Rash or Bruise. Hot to touch  Extremities: Symmetrical without obvious trauma or injury,  no effusions.  Data Review  CBC  Recent  Labs Lab 09/28/15 0930  WBC 8.5  HGB 13.1  HCT 43.0  PLT 187  MCV 83.7  MCH 25.5*  MCHC 30.5  RDW 15.6*  LYMPHSABS 1.6  MONOABS 1.1*  EOSABS 0.7  BASOSABS 0.0    Chemistries   Recent Labs Lab 09/28/15 0930  NA 139  K 4.2  CL 101  CO2 27  GLUCOSE 215*  BUN 19  CREATININE 1.13*  CALCIUM 9.1  AST 16  ALT 19  ALKPHOS 74  BILITOT 0.6    CrCl cannot be calculated (Unknown ideal weight.).  No results for input(s): TSH, T4TOTAL, T3FREE, THYROIDAB in the last 72 hours.  Invalid input(s): FREET3  Coagulation profile No results for input(s): INR, PROTIME in the last 168 hours.  No results for input(s): DDIMER in the last 72 hours.  Cardiac Enzymes No results for input(s): CKMB, TROPONINI, MYOGLOBIN in the last 168 hours.  Invalid input(s): CK  Invalid input(s): POCBNP  Urinalysis    Component Value Date/Time   COLORURINE YELLOW 09/28/2015 0958   APPEARANCEUR CLOUDY* 09/28/2015 0958   LABSPEC 1.017 09/28/2015 0958   PHURINE 5.5 09/28/2015 0958   GLUCOSEU NEGATIVE 09/28/2015 0958   HGBUR SMALL* 09/28/2015 0958   BILIRUBINUR NEGATIVE 09/28/2015 0958   KETONESUR NEGATIVE 09/28/2015 0958   PROTEINUR NEGATIVE 09/28/2015 0958   UROBILINOGEN 0.2 01/31/2008 0951   NITRITE POSITIVE* 09/28/2015 0958   LEUKOCYTESUR NEGATIVE 09/28/2015 0958    Imaging results:   Ct Head Wo Contrast  09/28/2015  CLINICAL DATA:  Status post fall, altered mental status. EXAM: CT HEAD WITHOUT CONTRAST TECHNIQUE: Contiguous axial images were obtained from the base of the skull through the vertex without intravenous contrast. COMPARISON:  October 21, 2012 FINDINGS: There is no midline shift, hydrocephalus, or mass. No acute hemorrhage or acute transcortical infarct is identified. There is chronic diffuse atrophy. The bony calvarium is intact. The visualized sinuses are clear. IMPRESSION: No focal acute intracranial abnormality identified. Chronic diffuse atrophy. Electronically Signed    By: Sherian Rein M.D.   On: 09/28/2015 10:02   Dg Chest Port 1 View  09/28/2015  CLINICAL DATA:  Wheezing and decreased oxygen saturation. EXAM: PORTABLE CHEST 1 VIEW COMPARISON:  05/01/2015 FINDINGS: Cardiomediastinal silhouette is normal. Mediastinal contours appear intact. There is low lung volume with coarsening of the interstitial markings. Patchy airspace consolidation is seen in the right lower lobe. Osseous structures are without acute abnormality. Soft tissues are grossly normal. IMPRESSION: Low lung volumes with coarsening of the interstitial markings, which may represent developing interstitial  edema. Patchy airspace consolidation in the right lower lobe. Given patient's history aspiration pneumonia is of consideration. Electronically Signed   By: Ted Mcalpine M.D.   On: 09/28/2015 10:01     EKG: (Independently reviewed) ST, rate 107, Qtc 446- no ischemia   Assessment & Plan  Principal Problem:   Left-sided weakness -tele -CVA wu (see order set) -MRI lumbar spine ? Disc dz -ivf WHILE npo -asa supp  Active Problems:   Asthma - duoneb for wheeze 2/2 PNA    Metabolic encephalopathy -? CVA vs infection -CVA WU as above -treat PNA -treat UTI    Acute respiratory failure with hypoxia/CAP vs asp pNA -see PNA order set -IS, duoneb, oxygen -failed swallow eval- SLP eval pending per CVA orders    Peripheral neuropathy  -chronic issue and ?? Contributing to current sx's -cont preadmit meds    Acute UTI -rocephin -fu cx    Anxiety disorder -cont preadmit meds -pt request sedation for MRI-low dose IV Ativan ordered    Diabetes mellitus, insulin dependent (IDDM), uncontrolled  -CBG > 200 -SSI -cont preadmit Toujeo -Hold metformin -ck HgbA1C    HTN  -HOLD losaartan/HCTZ    DVT Prophylaxis: Lovenox  Family Communication:   Husband/children at bedside  Code Status:  DNR  Condition:  guarded  Discharge disposition: Home when med stable  Time spent  in minutes : 60      Kharis Lapenna L. Allison on 09/28/2015 at 1:28 PM  You may contact me by going to [email or web address] - password [email or web address]  I am available from 7a-7p but please confirm I am on the schedule by going to Amion as above.   After 7p please contact night coverage person covering me after hours  [facility]

## 2015-09-28 NOTE — ED Notes (Signed)
Pt CBG, 191.

## 2015-09-28 NOTE — ED Notes (Signed)
Drenda FreezeFran, RN on [facility] made aware that MRI came to get patient before we could bring her up to the floor.

## 2015-09-28 NOTE — Progress Notes (Signed)
Patient not alert; temp. Elevated; continue NPO per swallow screen; await speech therapy eval.

## 2015-09-28 NOTE — ED Notes (Addendum)
Pt arrives via [facility] ems, ems reports they were called out by patients family after patient rolled out of her bed onto the floor this am, pt stated to ems that her "legs wouldn't work." ems reports patient was 86% on RA with wheezing in upper lobes, pt received 1 albuterol breathing tx, and was placed on 4L O2. Ems reported cbg of 212, pt oriented to person only.

## 2015-09-28 NOTE — ED Provider Notes (Signed)
CSN: [id number]     Arrival date & time 09/28/15  0920 History   First MD Initiated Contact with Patient 09/28/15 [phone]     Chief Complaint  Patient presents with  . Weakness  . Altered Mental Status     (Consider location/radiation/quality/duration/timing/severity/associated sxs/prior Treatment) Patient is a 74 y.o. female presenting with altered mental status. The history is provided by the spouse.  Altered Mental Status Presenting symptoms: behavior changes, confusion, disorientation, lethargy, memory loss and partial responsiveness   Severity:  Severe Most recent episode:  Today Episode history:  Continuous Duration:  1 hour Timing:  Constant Progression:  Unchanged Chronicity:  New Context comment:  Woke up with acute change in mental status, stating inability to move legs Associated symptoms: agitation, slurred speech and weakness   Associated symptoms: no abdominal pain, no bladder incontinence and no fever     Past Medical History  Diagnosis Date  . Diabetes mellitus without complication (HCC)   . Hypertension    Past Surgical History  Procedure Laterality Date  . Abdominal hysterectomy     No family history on file. Social History  Substance Use Topics  . Smoking status: Never Smoker   . Smokeless tobacco: None  . Alcohol Use: No   OB History    No data available     Review of Systems  Constitutional: Negative for fever.  Gastrointestinal: Negative for abdominal pain.  Genitourinary: Negative for bladder incontinence.  Neurological: Positive for weakness.  Psychiatric/Behavioral: Positive for memory loss, confusion and agitation.  All other systems reviewed and are negative.     Allergies  Etodolac  Home Medications   Prior to Admission medications   Medication Sig Start Date End Date Taking? Authorizing Provider  ALPRAZolam (XANAX) 0.25 MG tablet Take 0.25 mg by mouth 2 (two) times daily as needed for anxiety or sleep.   Yes Historical  Provider, MD  aspirin EC 81 MG tablet Take 81 mg by mouth daily.   Yes Historical Provider, MD  atorvastatin (LIPITOR) 10 MG tablet Take 10 mg by mouth daily.   Yes Historical Provider, MD  cetirizine (ZYRTEC) 10 MG tablet Take 10 mg by mouth daily as needed for allergies.   Yes Historical Provider, MD  DULoxetine (CYMBALTA) 60 MG capsule Take 60 mg by mouth daily. 09/18/15  Yes Historical Provider, MD  gabapentin (NEURONTIN) 400 MG capsule Take 400 mg by mouth 3 (three) times daily as needed. 07/27/15  Yes Historical Provider, MD  HYDROcodone-acetaminophen (NORCO/VICODIN) 5-325 MG tablet Take 1 tablet by mouth every 6 (six) hours as needed for moderate pain.   Yes Historical Provider, MD  Insulin Glargine (TOUJEO SOLOSTAR [place]) Inject 40 Units into the skin daily.   Yes Historical Provider, MD  losartan-hydrochlorothiazide (HYZAAR) 100-25 MG tablet Take 1 tablet by mouth daily. 09/04/15  Yes Historical Provider, MD  metFORMIN (GLUCOPHAGE) 500 MG tablet Take 1,000 mg by mouth 2 (two) times daily. 09/19/15  Yes Historical Provider, MD  PROAIR HFA 108 (90 BASE) MCG/ACT inhaler Inhale 2 puffs into the lungs every 4 (four) hours as needed for wheezing.  09/03/15  Yes Historical Provider, MD   BP 121/57 mmHg  Pulse 97  Temp(Src) 98.7 F (37.1 C) (Rectal)  Resp 18  SpO2 100% Physical Exam  Constitutional: She appears well-developed. She appears listless. She has a sickly appearance.  HENT:  Head: Normocephalic.  Eyes: Conjunctivae are normal.  Neck: Neck supple. No tracheal deviation present.  Cardiovascular: Normal rate and regular rhythm.  Pulmonary/Chest: Effort normal. No respiratory distress.  Abdominal: Soft. She exhibits no distension.  Neurological: She has normal strength. She appears listless. She is disoriented. Coordination abnormal. GCS eye subscore is 2. GCS verbal subscore is 4. GCS motor subscore is 5.  Unable to perform finger to nose testing, patient was feel the rest of her  face to localize her nose, severe dysmetria, difficulty following commands  Skin: Skin is warm and dry.  Psychiatric: Her affect is blunt. Her speech is slurred.    ED Course  Procedures (including critical care time) Labs Review Labs Reviewed  COMPREHENSIVE METABOLIC PANEL - Abnormal; Notable for the following:    Glucose, Bld 215 (*)    Creatinine, Ser 1.13 (*)    GFR calc non Af Amer 47 (*)    GFR calc Af Amer 54 (*)    All other components within normal limits  CBC WITH DIFFERENTIAL/PLATELET - Abnormal; Notable for the following:    RBC 5.14 (*)    MCH 25.5 (*)    RDW 15.6 (*)    Monocytes Absolute 1.1 (*)    All other components within normal limits  CBG MONITORING, ED - Abnormal; Notable for the following:    Glucose-Capillary 191 (*)    All other components within normal limits  I-STAT ARTERIAL BLOOD GAS, ED - Abnormal; Notable for the following:    pCO2 arterial 48.1 (*)    Bicarbonate 27.7 (*)    All other components within normal limits  URINE CULTURE  AMMONIA  URINALYSIS, ROUTINE W REFLEX MICROSCOPIC (NOT [facility])  I-STAT CG4 LACTIC ACID, ED    Imaging Review Ct Head Wo Contrast  09/28/2015  CLINICAL DATA:  Status post fall, altered mental status. EXAM: CT HEAD WITHOUT CONTRAST TECHNIQUE: Contiguous axial images were obtained from the base of the skull through the vertex without intravenous contrast. COMPARISON:  October 21, 2012 FINDINGS: There is no midline shift, hydrocephalus, or mass. No acute hemorrhage or acute transcortical infarct is identified. There is chronic diffuse atrophy. The bony calvarium is intact. The visualized sinuses are clear. IMPRESSION: No focal acute intracranial abnormality identified. Chronic diffuse atrophy. Electronically Signed   By: Sherian ReinWei-Chen  Lin M.D.   On: 09/28/2015 10:02   Dg Chest Port 1 View  09/28/2015  CLINICAL DATA:  Wheezing and decreased oxygen saturation. EXAM: PORTABLE CHEST 1 VIEW COMPARISON:  05/01/2015 FINDINGS:  Cardiomediastinal silhouette is normal. Mediastinal contours appear intact. There is low lung volume with coarsening of the interstitial markings. Patchy airspace consolidation is seen in the right lower lobe. Osseous structures are without acute abnormality. Soft tissues are grossly normal. IMPRESSION: Low lung volumes with coarsening of the interstitial markings, which may represent developing interstitial edema. Patchy airspace consolidation in the right lower lobe. Given patient's history aspiration pneumonia is of consideration. Electronically Signed   By: Ted Mcalpineobrinka  Dimitrova M.D.   On: 09/28/2015 10:01   I have personally reviewed and evaluated these images and lab results as part of my medical decision-making.   EKG Interpretation   Date/Time:  Saturday September 28 2015 09:24:30 EST Ventricular Rate:  107 PR Interval:  179 QRS Duration: 81 QT Interval:  334 QTC Calculation: 446 R Axis:   73 Text Interpretation:  Sinus tachycardia Low voltage, precordial leads No  significant change since last tracing SINCE LAST TRACING HEART RATE HAS  INCREASED Confirmed by Misha Antonini MD, Nobuko Gsell ([id number]) on 09/28/2015 9:34:04 AM      MDM   Final diagnoses:  Altered mental status  Hypoxemia  Dysmetria    74 year old female presents with acute encephalopathy that occurred this morning. Last night she went to bed, which was her last known well time. At that time she complained of a headache. She had no preceding cough, confusion, fevers or other difficulty. Husband found patient on the floor this morning after she crawled out of bed, states that she had inability to use her legs. She was hypoxemic on arrival with EMS and received a breathing treatment in route.  On arrival patient is only oriented to self, severely confused, has failed swallow evaluation at bedside. Chest x-ray is concerning for potential aspiration due to encephalopathy or primary pneumonia. Urinalysis has positive nitrites suspicious  for acute urinary tract infection. Covered with Zosyn for presumed aspiration versus urinary tract infection given the clinical context. She is beginning to clear her mental status slightly. No significant CO2 retention on blood gas to explain altered mental status. Patient has tachycardia but no other SIRS criteria to suggest sepsis.  Neurology was consulted for potential stroke given the patient's dense confusion and dysmetria upon waking, recommending MRI. Hospitalist was consulted for admission and will see the patient in the emergency department.     Lyndal Pulley, MD 09/29/15 [phone]

## 2015-09-28 NOTE — Progress Notes (Addendum)
Mg 1.5 so will give 2 GM IV bolus and repeat level in am. Phos was normal as was CK. Lactic acid and PCT also normal.  MRI brain without evidence of acute CVA. MRA head motion degraded bu no significant findings- MR lumbar spine results pending  Junious SilkAllison Ellis, ANP

## 2015-09-28 NOTE — ED Notes (Signed)
MD at bedside. 

## 2015-09-28 NOTE — ED Notes (Signed)
Patient transported to MRI 

## 2015-09-29 ENCOUNTER — Observation Stay (HOSPITAL_COMMUNITY): Payer: Medicare Other

## 2015-09-29 DIAGNOSIS — Z7984 Long term (current) use of oral hypoglycemic drugs: Secondary | ICD-10-CM | POA: Diagnosis not present

## 2015-09-29 DIAGNOSIS — I10 Essential (primary) hypertension: Secondary | ICD-10-CM | POA: Diagnosis present

## 2015-09-29 DIAGNOSIS — F419 Anxiety disorder, unspecified: Secondary | ICD-10-CM | POA: Diagnosis present

## 2015-09-29 DIAGNOSIS — R531 Weakness: Secondary | ICD-10-CM | POA: Diagnosis not present

## 2015-09-29 DIAGNOSIS — R402122 Coma scale, eyes open, to pain, at arrival to emergency department: Secondary | ICD-10-CM | POA: Diagnosis present

## 2015-09-29 DIAGNOSIS — R29810 Facial weakness: Secondary | ICD-10-CM | POA: Diagnosis present

## 2015-09-29 DIAGNOSIS — E108 Type 1 diabetes mellitus with unspecified complications: Secondary | ICD-10-CM

## 2015-09-29 DIAGNOSIS — M5126 Other intervertebral disc displacement, lumbar region: Secondary | ICD-10-CM | POA: Diagnosis present

## 2015-09-29 DIAGNOSIS — R0902 Hypoxemia: Secondary | ICD-10-CM | POA: Diagnosis present

## 2015-09-29 DIAGNOSIS — G9341 Metabolic encephalopathy: Secondary | ICD-10-CM | POA: Diagnosis present

## 2015-09-29 DIAGNOSIS — E1165 Type 2 diabetes mellitus with hyperglycemia: Secondary | ICD-10-CM | POA: Diagnosis present

## 2015-09-29 DIAGNOSIS — Z794 Long term (current) use of insulin: Secondary | ICD-10-CM | POA: Diagnosis not present

## 2015-09-29 DIAGNOSIS — E1142 Type 2 diabetes mellitus with diabetic polyneuropathy: Secondary | ICD-10-CM | POA: Diagnosis present

## 2015-09-29 DIAGNOSIS — T380X5A Adverse effect of glucocorticoids and synthetic analogues, initial encounter: Secondary | ICD-10-CM | POA: Diagnosis present

## 2015-09-29 DIAGNOSIS — B9620 Unspecified Escherichia coli [E. coli] as the cause of diseases classified elsewhere: Secondary | ICD-10-CM | POA: Diagnosis present

## 2015-09-29 DIAGNOSIS — Z66 Do not resuscitate: Secondary | ICD-10-CM | POA: Diagnosis present

## 2015-09-29 DIAGNOSIS — N390 Urinary tract infection, site not specified: Secondary | ICD-10-CM | POA: Diagnosis present

## 2015-09-29 DIAGNOSIS — I4891 Unspecified atrial fibrillation: Secondary | ICD-10-CM | POA: Diagnosis not present

## 2015-09-29 DIAGNOSIS — J189 Pneumonia, unspecified organism: Secondary | ICD-10-CM | POA: Diagnosis present

## 2015-09-29 DIAGNOSIS — Z90710 Acquired absence of both cervix and uterus: Secondary | ICD-10-CM | POA: Diagnosis not present

## 2015-09-29 DIAGNOSIS — Z7951 Long term (current) use of inhaled steroids: Secondary | ICD-10-CM | POA: Diagnosis not present

## 2015-09-29 DIAGNOSIS — M6289 Other specified disorders of muscle: Secondary | ICD-10-CM | POA: Diagnosis not present

## 2015-09-29 DIAGNOSIS — R402242 Coma scale, best verbal response, confused conversation, at arrival to emergency department: Secondary | ICD-10-CM | POA: Diagnosis present

## 2015-09-29 DIAGNOSIS — J45901 Unspecified asthma with (acute) exacerbation: Secondary | ICD-10-CM

## 2015-09-29 DIAGNOSIS — R402352 Coma scale, best motor response, localizes pain, at arrival to emergency department: Secondary | ICD-10-CM | POA: Diagnosis present

## 2015-09-29 DIAGNOSIS — Z8701 Personal history of pneumonia (recurrent): Secondary | ICD-10-CM | POA: Diagnosis not present

## 2015-09-29 DIAGNOSIS — F329 Major depressive disorder, single episode, unspecified: Secondary | ICD-10-CM | POA: Diagnosis present

## 2015-09-29 DIAGNOSIS — J9601 Acute respiratory failure with hypoxia: Secondary | ICD-10-CM | POA: Diagnosis present

## 2015-09-29 DIAGNOSIS — E1065 Type 1 diabetes mellitus with hyperglycemia: Secondary | ICD-10-CM

## 2015-09-29 LAB — GLUCOSE, CAPILLARY
GLUCOSE-CAPILLARY: 170 mg/dL — AB (ref 65–99)
GLUCOSE-CAPILLARY: 179 mg/dL — AB (ref 65–99)
GLUCOSE-CAPILLARY: 215 mg/dL — AB (ref 65–99)
GLUCOSE-CAPILLARY: 232 mg/dL — AB (ref 65–99)
GLUCOSE-CAPILLARY: 361 mg/dL — AB (ref 65–99)
GLUCOSE-CAPILLARY: 382 mg/dL — AB (ref 65–99)
Glucose-Capillary: 229 mg/dL — ABNORMAL HIGH (ref 65–99)

## 2015-09-29 LAB — COMPREHENSIVE METABOLIC PANEL
ALK PHOS: 64 U/L (ref 38–126)
ALT: 22 U/L (ref 14–54)
ANION GAP: 7 (ref 5–15)
AST: 21 U/L (ref 15–41)
Albumin: 2.7 g/dL — ABNORMAL LOW (ref 3.5–5.0)
BILIRUBIN TOTAL: 0.5 mg/dL (ref 0.3–1.2)
BUN: 19 mg/dL (ref 6–20)
CALCIUM: 8.6 mg/dL — AB (ref 8.9–10.3)
CO2: 26 mmol/L (ref 22–32)
Chloride: 105 mmol/L (ref 101–111)
Creatinine, Ser: 0.95 mg/dL (ref 0.44–1.00)
GFR, EST NON AFRICAN AMERICAN: 58 mL/min — AB (ref >60)
Glucose, Bld: 308 mg/dL — ABNORMAL HIGH (ref 65–99)
Potassium: 3.9 mmol/L (ref 3.5–5.1)
Sodium: 138 mmol/L (ref 135–145)
TOTAL PROTEIN: 7 g/dL (ref 6.5–8.1)

## 2015-09-29 LAB — CBC
HEMATOCRIT: 39.1 % (ref 36.0–46.0)
HEMOGLOBIN: 11.8 g/dL — AB (ref 12.0–15.0)
MCH: 24.7 pg — AB (ref 26.0–34.0)
MCHC: 30.2 g/dL (ref 30.0–36.0)
MCV: 82 fL (ref 78.0–100.0)
Platelets: 209 10*3/uL (ref 150–400)
RBC: 4.77 MIL/uL (ref 3.87–5.11)
RDW: 15.2 % (ref 11.5–15.5)
WBC: 5.9 10*3/uL (ref 4.0–10.5)

## 2015-09-29 LAB — EXPECTORATED SPUTUM ASSESSMENT W REFEX TO RESP CULTURE

## 2015-09-29 LAB — LIPID PANEL
CHOLESTEROL: 125 mg/dL (ref 0–200)
HDL: 53 mg/dL (ref >40)
LDL CALC: 65 mg/dL (ref 0–99)
TRIGLYCERIDES: 35 mg/dL (ref <150)
Total CHOL/HDL Ratio: 2.4 RATIO
VLDL: 7 mg/dL (ref 0–40)

## 2015-09-29 LAB — EXPECTORATED SPUTUM ASSESSMENT W GRAM STAIN, RFLX TO RESP C

## 2015-09-29 LAB — MAGNESIUM: MAGNESIUM: 1.8 mg/dL (ref 1.7–2.4)

## 2015-09-29 LAB — HIV ANTIBODY (ROUTINE TESTING W REFLEX): HIV Screen 4th Generation wRfx: NONREACTIVE

## 2015-09-29 NOTE — Evaluation (Signed)
Clinical/Bedside Swallow Evaluation Patient Details  Name: [name] MRN: [id number] Date of Birth: 03/27/1941  Today's Date: 09/29/2015 Time: SLP Start Time (ACUTE ONLY): 1103 SLP Stop Time (ACUTE ONLY): 1120 SLP Time Calculation (min) (ACUTE ONLY): 17 min  Past Medical History:  Past Medical History  Diagnosis Date  . Diabetes mellitus without complication (HCC)   . Hypertension    Past Surgical History:  Past Surgical History  Procedure Laterality Date  . Abdominal hysterectomy     HPI:  74 year old female history of diabetes, hypertension, depression, anxiety who presented to the ED with left-sided weakness, left facial weakness (improved since seen in ED per notes), subjective fevers, generalized weakness, dysuria and altered mental status.  Head CT which was done was unremarkable. Per MD notes, likely metabolic encephalopathy secondary to community acquired pneumonia and urinary tract infection. CXR: Patchy airspace consolidation right lower lobe. Given patient's reported history aspiration pneumonia is of consideration. H/o esophagram with small hiatal hernia.    Assessment / Plan / Recommendation Clinical Impression  Patient presents with a functional appearing oropharyngeal swallow with appropriate oral transit of bolus and timely appearing swallow initiation. SLP will f/u x1 given question of aspiration PNA on chest xray and confusion with question of decreased knowledge of history.     Aspiration Risk  Mild aspiration risk    Diet Recommendation Regular;Thin liquid   Liquid Administration via: Cup;Straw Medication Administration: Whole meds with liquid Supervision: Patient able to self feed Compensations: Slow rate;Small sips/bites Postural Changes: Seated upright at 90 degrees    Other  Recommendations Oral Care Recommendations: Oral care BID   Follow up Recommendations  None    Frequency and Duration min 1 x/week  1 week           Swallow Study   General  HPI: 74 year old female history of diabetes, hypertension, depression, anxiety who presented to the ED with left-sided weakness, left facial weakness (improved since seen in ED per notes), subjective fevers, generalized weakness, dysuria and altered mental status.  Head CT which was done was unremarkable. Per MD notes, likely metabolic encephalopathy secondary to community acquired pneumonia and urinary tract infection. CXR: Patchy airspace consolidation right lower lobe. Given patient's reported history aspiration pneumonia is of consideration. H/o esophagram with small hiatal hernia.  Type of Study: Bedside Swallow Evaluation Previous Swallow Assessment: none noted Diet Prior to this Study: NPO Temperature Spikes Noted: Yes Respiratory Status: Nasal cannula History of Recent Intubation: No Behavior/Cognition: Alert;Cooperative;Pleasant mood Oral Cavity Assessment: Within Functional Limits Oral Care Completed by SLP: No Oral Cavity - Dentition: Adequate natural dentition Vision: Functional for self-feeding Self-Feeding Abilities: Able to feed self Patient Positioning: Upright in bed Baseline Vocal Quality: Normal Volitional Cough: Strong;Congested Volitional Swallow: Able to elicit    Oral/Motor/Sensory Function Overall Oral Motor/Sensory Function: Within functional limits   Ice Chips Ice chips: Not tested   Thin Liquid Thin Liquid: Within functional limits (cough x1 with straw sip out of 10-12 ounces of water) Presentation: Cup;Self Fed;Straw    Nectar Thick Nectar Thick Liquid: Not tested   Honey Thick Honey Thick Liquid: Not tested   Puree Puree: Within functional limits Presentation: Spoon   Solid Solid: Within functional limits Presentation: Self Fed      Skyla Champagne MA, CCC-SLP [phone]  [name] 09/29/2015,11:20 AM

## 2015-09-29 NOTE — Progress Notes (Signed)
Abigail DecemberShay Allison from microbiology called r/t pt's sputum collection, too many epithial cells, not acceptable at this time. Staff will need to recollect. Attending RN notified.    Abigail BoastHavy, RN

## 2015-09-29 NOTE — Progress Notes (Addendum)
[facility] PROGRESS NOTE  [name] JYN:[id number] DOB: 02/02/1941 DOA: 09/28/2015  PCP: Ginette OttoSTONEKING,HAL THOMAS, MD  Brief HPI: 74 year old Caucasian female with a past medical history of diabetes, hypertension, depression, presented to the emergency department with fever, weakness, with some concern for left-sided weakness as well. She had been complaining of dysuria. She was also noted to be confused. Evaluation in the ED revealed community-acquired pneumonia and a UTI. Left-sided weakness subsided. MRI was negative for stroke.  Past medical history:  Past Medical History  Diagnosis Date  . Diabetes mellitus without complication (HCC)   . Hypertension     Consultants: None  Procedures: None  Antibiotics: Ceftriaxone and azithromycin 12/24  Subjective: Patient feels slightly better this morning. Continues to have some wheezing and cough. Denies any nausea or vomiting.  Objective: Vital Signs  Filed Vitals:   09/29/15 0400 09/29/15 0604 09/29/15 0819 09/29/15 1034  BP: 140/64 141/63  136/64  Pulse: 100 99  99  Temp: 98.9 F (37.2 C) 98.7 F (37.1 C)  98.7 F (37.1 C)  TempSrc: Oral Oral  Oral  Resp: 18 18  18   Height:      Weight:      SpO2: 98% 97% 94% 94%    Intake/Output Summary (Last 24 hours) at 09/29/15 1314 Last data filed at 09/29/15 1200  Gross per 24 hour  Intake    480 ml  Output      2 ml  Net    478 ml   Filed Weights   09/28/15 1800  Weight: 85.1 kg (187 lb 9.8 oz)    General appearance: alert, cooperative, appears stated age, no distress and moderately obese Resp: End expiratory wheezing bilaterally with few rhonchi. Crackles at the bases. Cardio: regular rate and rhythm, S1, S2 normal, no murmur, click, rub or gallop GI: soft, non-tender; bowel sounds normal; no masses,  no organomegaly Extremities: extremities normal, atraumatic, no cyanosis or edema Neurologic: She is alert and oriented 3. No facial asymmetry. Motor strength  equal bilateral upper extremities. Somewhat weak in the left leg which is chronic per patient.  Lab Results:  Basic Metabolic Panel:  Recent Labs Lab 09/28/15 0930 09/28/15 1400 09/29/15 0508  NA 139  --   --   K 4.2  --   --   CL 101  --   --   CO2 27  --   --   GLUCOSE 215*  --   --   BUN 19  --   --   CREATININE 1.13*  --   --   CALCIUM 9.1  --   --   MG  --  1.5* 1.8  PHOS  --  3.4  --    Liver Function Tests:  Recent Labs Lab 09/28/15 0930  AST 16  ALT 19  ALKPHOS 74  BILITOT 0.6  PROT 7.9  ALBUMIN 3.5    Recent Labs Lab 09/28/15 0930  AMMONIA 34   CBC:  Recent Labs Lab 09/28/15 0930  WBC 8.5  NEUTROABS 5.1  HGB 13.1  HCT 43.0  MCV 83.7  PLT 187   Cardiac Enzymes:  Recent Labs Lab 09/28/15 1400  CKTOTAL 79   BNP (last 3 results)  Recent Labs  09/28/15 1400  BNP 68.5    CBG:  Recent Labs Lab 09/29/15 09/29/15 0040 09/29/15 0351 09/29/15 0740 09/29/15 1121  GLUCAP 179* 170* 215* 229* 232*    Recent Results (from the past 240 hour(s))  Culture, sputum-assessment  Status: None   Collection Time: 09/28/15  5:12 PM  Result Value Ref Range Status   Specimen Description SPUTUM  Final   Special Requests NONE  Final   Sputum evaluation   Final    MICROSCOPIC FINDINGS SUGGEST THAT THIS SPECIMEN IS NOT REPRESENTATIVE OF LOWER RESPIRATORY SECRETIONS. PLEASE RECOLLECT. Gram Stain Report Called to,Read Back By and Verified With: H. MCINTOSH,RN AT 1308 ON [id number] BY Lucienne Capers    Report Status 09/29/2015 FINAL  Final      Studies/Results: Ct Head Wo Contrast  09/28/2015  CLINICAL DATA:  Status post fall, altered mental status. EXAM: CT HEAD WITHOUT CONTRAST TECHNIQUE: Contiguous axial images were obtained from the base of the skull through the vertex without intravenous contrast. COMPARISON:  October 21, 2012 FINDINGS: There is no midline shift, hydrocephalus, or mass. No acute hemorrhage or acute transcortical infarct is  identified. There is chronic diffuse atrophy. The bony calvarium is intact. The visualized sinuses are clear. IMPRESSION: No focal acute intracranial abnormality identified. Chronic diffuse atrophy. Electronically Signed   By: Sherian Rein M.D.   On: 09/28/2015 10:02   Mr Maxine Glenn Head Wo Contrast  09/28/2015  CLINICAL DATA:  New onset left-sided weakness. Left facial weakness. EXAM: MRI HEAD WITHOUT CONTRAST MRA HEAD WITHOUT CONTRAST TECHNIQUE: Multiplanar, multiecho pulse sequences of the brain and surrounding structures were obtained without intravenous contrast. Angiographic images of the head were obtained using MRA technique without contrast. COMPARISON:  CT head without contrast from the same day. FINDINGS: MRI HEAD FINDINGS The diffusion-weighted images demonstrate no evidence for acute or subacute infarct. There is no acute hemorrhage or mass lesion. The study is mildly degraded by patient motion. Moderate periventricular and subcortical T2 changes are somewhat advanced for age. Moderate T2 changes are present within the central pons. Remote lacunar infarcts are evident in the inferior right cerebellum. The internal auditory canals are within normal limits. Flow is present in the major intracranial arteries. Bilateral lens replacements are present. The paranasal sinuses and mastoid air cells are clear. MRA HEAD FINDINGS The study is moderately degraded by patient motion, particularly in the upper portions. The high cervical internal carotid arteries are within normal limits bilaterally. There may be some narrowing of the cavernous left internal carotid arteries. The A1 and M1 segments appear to be intact. The MCA bifurcations are intact. The branch vessels are severely degraded by motion. The left vertebral artery is slightly dominant to the right. Mild narrowing is suspected at the left vertebral artery dural margin. The PICA origins are visualized and normal bilaterally. The basilar artery is normal. The  left posterior cerebral artery originates from the basilar tip. The right posterior cerebral artery is of fetal type. IMPRESSION: 1. No acute or subacute infarct to explain the patient's symptoms. 2. Moderate periventricular and subcortical white matter changes likely reflect the sequela of chronic microvascular ischemia. 3. Moderate white matter changes are noted in the pons is well. 4. Remote lacunar infarct of the right cerebellum. 5. Probable mild narrowing of the left vertebral artery at the dural margin in the cavernous left internal carotid artery. 6. The MRA is severely degraded by patient motion in evaluation of distal vessels is not possible. Electronically Signed   By: Marin Roberts M.D.   On: 09/28/2015 17:04   Mr Brain Wo Contrast  09/28/2015  CLINICAL DATA:  New onset left-sided weakness. Left facial weakness. EXAM: MRI HEAD WITHOUT CONTRAST MRA HEAD WITHOUT CONTRAST TECHNIQUE: Multiplanar, multiecho pulse sequences of the brain and  surrounding structures were obtained without intravenous contrast. Angiographic images of the head were obtained using MRA technique without contrast. COMPARISON:  CT head without contrast from the same day. FINDINGS: MRI HEAD FINDINGS The diffusion-weighted images demonstrate no evidence for acute or subacute infarct. There is no acute hemorrhage or mass lesion. The study is mildly degraded by patient motion. Moderate periventricular and subcortical T2 changes are somewhat advanced for age. Moderate T2 changes are present within the central pons. Remote lacunar infarcts are evident in the inferior right cerebellum. The internal auditory canals are within normal limits. Flow is present in the major intracranial arteries. Bilateral lens replacements are present. The paranasal sinuses and mastoid air cells are clear. MRA HEAD FINDINGS The study is moderately degraded by patient motion, particularly in the upper portions. The high cervical internal carotid arteries  are within normal limits bilaterally. There may be some narrowing of the cavernous left internal carotid arteries. The A1 and M1 segments appear to be intact. The MCA bifurcations are intact. The branch vessels are severely degraded by motion. The left vertebral artery is slightly dominant to the right. Mild narrowing is suspected at the left vertebral artery dural margin. The PICA origins are visualized and normal bilaterally. The basilar artery is normal. The left posterior cerebral artery originates from the basilar tip. The right posterior cerebral artery is of fetal type. IMPRESSION: 1. No acute or subacute infarct to explain the patient's symptoms. 2. Moderate periventricular and subcortical white matter changes likely reflect the sequela of chronic microvascular ischemia. 3. Moderate white matter changes are noted in the pons is well. 4. Remote lacunar infarct of the right cerebellum. 5. Probable mild narrowing of the left vertebral artery at the dural margin in the cavernous left internal carotid artery. 6. The MRA is severely degraded by patient motion in evaluation of distal vessels is not possible. Electronically Signed   By: Marin Roberts M.D.   On: 09/28/2015 17:04   Mr Lumbar Spine Wo Contrast  09/28/2015  CLINICAL DATA:  Rolled out of bed onto floor.  Unable to use legs. EXAM: MRI LUMBAR SPINE WITHOUT CONTRAST TECHNIQUE: Multiplanar, multisequence MR imaging of the lumbar spine was performed. No intravenous contrast was administered. COMPARISON:  MRI lumbar spine 03/27/2009 FINDINGS: Normal signal is present in the conus medullaris which terminates at L1-2. A prominent hemangioma is again noted at L1. A smaller hemangioma is present at T12. Chronic endplate marrow changes are again noted at L5-S1. Levoconvex curvature is present at thoracolumbar junction. Limiting imaging of the abdomen demonstrates no focal lesions. There is no significant adenopathy. L1-2:  Minimal disc bulging is present  without significant stenosis. L2-3:  Negative. L3-4: A broad-based disc protrusion is present. There is progression of mild bilateral subarticular stenosis. The foramina are patent. L4-5: Mild disc bulging and facet hypertrophy are stable. There is no significant stenosis. L5-S1: A left laminectomy is again noted. There is progression of a right paramedian disc protrusion. Mild subarticular narrowing is worse right than left. The foramina are patent. IMPRESSION: 1. Progression of broad-based disc protrusion and mild bilateral subarticular stenosis at L3-4. 2. Similar appearance of mild disc bulging and facet hypertrophy at L4-5 without significant stenosis. 3. Progressive right paramedian disc protrusion with mild subarticular narrowing bilaterally, right greater than left. Electronically Signed   By: Marin Roberts M.D.   On: 09/28/2015 17:18   Dg Chest Port 1 View  09/28/2015  CLINICAL DATA:  Wheezing and decreased oxygen saturation. EXAM: PORTABLE CHEST 1 VIEW  COMPARISON:  05/01/2015 FINDINGS: Cardiomediastinal silhouette is normal. Mediastinal contours appear intact. There is low lung volume with coarsening of the interstitial markings. Patchy airspace consolidation is seen in the right lower lobe. Osseous structures are without acute abnormality. Soft tissues are grossly normal. IMPRESSION: Low lung volumes with coarsening of the interstitial markings, which may represent developing interstitial edema. Patchy airspace consolidation in the right lower lobe. Given patient's history aspiration pneumonia is of consideration. Electronically Signed   By: Ted Mcalpine M.D.   On: 09/28/2015 10:01    Medications:  Scheduled: . arformoterol  15 mcg Nebulization BID  . aspirin  300 mg Rectal Daily   Or  . aspirin  325 mg Oral Daily  . aspirin  300 mg Rectal Daily  . atorvastatin  10 mg Oral Daily  . azithromycin  500 mg Intravenous Q24H  . budesonide (PULMICORT) nebulizer solution  0.25 mg  Nebulization BID  . cefTRIAXone (ROCEPHIN)  IV  1 g Intravenous Q24H  . DULoxetine  60 mg Oral Daily  . enoxaparin (LOVENOX) injection  40 mg Subcutaneous Q24H  . fluticasone  2 spray Each Nare Daily  . gabapentin  400 mg Oral TID  . insulin aspart  0-15 Units Subcutaneous 6 times per day  . insulin glargine  40 Units Subcutaneous Daily  . ipratropium-albuterol  3 mL Nebulization TID  . loratadine  10 mg Oral Daily  . methylPREDNISolone (SOLU-MEDROL) injection  60 mg Intravenous 3 times per day   Continuous:  ZOX:[id number], ALPRAZolam  Assessment/Plan:  Principal Problem:   Left-sided weakness Active Problems:   Anxiety disorder   Peripheral neuropathy (HCC)   Asthma   Metabolic encephalopathy   Acute respiratory failure with hypoxia (HCC)   CAP (community acquired pneumonia)   Diabetes mellitus, insulin dependent (IDDM), uncontrolled (HCC)   HTN (hypertension)   Acute UTI   Left leg weakness    Questionable Left-sided weakness None noted. This morning, although the patient does have chronic left lower extremity weakness. MRI was negative for stroke. MRI of the lumbar spine did show some disc protrusion, however, no concerning findings noted. PT and OT evaluation. Cleared by speech therapist. It is quite likely that her infectious process may have caused encephalopathy and weakness, which could have been misinterpreted as stroke like symptoms.  Acute respiratory failure with hypoxia/CAP vs asp pNA Chest x-ray revealed evidence for pneumonia. Continue antibiotics intravenously for now. Oxygen as needed. Seen by speech therapist and is on a regular diet now.  Metabolic encephalopathy Likely secondary to UTI and pneumonia. Mental status appears to be improved this morning. Continue to monitor for now.  Urinary tract infection Urine cultures are pending. Continue current antibiotics.  History of Asthma with mild exacerbation Patient is noted to have bilateral wheezing.  Continue inhalers. Agree with steroids for now. Continue Nebulizer treatments.  History of Peripheral neuropathy  Continue home medications  Anxiety disorder Continue home medications.  Diabetes mellitus, insulin dependent (IDDM), uncontrolled  Holding metformin. Continue Lantus and SSI. HbA1c is pending.  Essential hypertension   Blood pressure is reasonably well controlled. Her antihypertensives are on hold for now.  DVT Prophylaxis: Lovenox    Code Status: DO NOT RESUSCITATE  Family Communication: Discussed with the patient. No family at bedside  Disposition Plan: Continue current treatment. PT and OT to evaluate.      [facility]  [facility] Pager [phone] 09/29/2015, 1:14 PM  If 7PM-7AM, please contact night-coverage at [email or web address], password [facility]

## 2015-09-30 ENCOUNTER — Inpatient Hospital Stay (HOSPITAL_COMMUNITY): Payer: Medicare Other

## 2015-09-30 DIAGNOSIS — J9601 Acute respiratory failure with hypoxia: Secondary | ICD-10-CM

## 2015-09-30 DIAGNOSIS — R531 Weakness: Secondary | ICD-10-CM

## 2015-09-30 LAB — CBC
HCT: 37.9 % (ref 36.0–46.0)
HEMOGLOBIN: 11.5 g/dL — AB (ref 12.0–15.0)
MCH: 24.8 pg — AB (ref 26.0–34.0)
MCHC: 30.3 g/dL (ref 30.0–36.0)
MCV: 81.9 fL (ref 78.0–100.0)
Platelets: 215 10*3/uL (ref 150–400)
RBC: 4.63 MIL/uL (ref 3.87–5.11)
RDW: 15.4 % (ref 11.5–15.5)
WBC: 8 10*3/uL (ref 4.0–10.5)

## 2015-09-30 LAB — BASIC METABOLIC PANEL
Anion gap: 9 (ref 5–15)
BUN: 21 mg/dL — AB (ref 6–20)
CHLORIDE: 102 mmol/L (ref 101–111)
CO2: 26 mmol/L (ref 22–32)
Calcium: 8.5 mg/dL — ABNORMAL LOW (ref 8.9–10.3)
Creatinine, Ser: 0.95 mg/dL (ref 0.44–1.00)
GFR calc Af Amer: >60 mL/min (ref >60)
GFR calc non Af Amer: 58 mL/min — ABNORMAL LOW (ref >60)
GLUCOSE: 192 mg/dL — AB (ref 65–99)
POTASSIUM: 4.4 mmol/L (ref 3.5–5.1)
Sodium: 137 mmol/L (ref 135–145)

## 2015-09-30 LAB — PROCALCITONIN: Procalcitonin: <0.1 ng/mL

## 2015-09-30 LAB — GLUCOSE, CAPILLARY
GLUCOSE-CAPILLARY: 241 mg/dL — AB (ref 65–99)
GLUCOSE-CAPILLARY: 352 mg/dL — AB (ref 65–99)
GLUCOSE-CAPILLARY: 373 mg/dL — AB (ref 65–99)
GLUCOSE-CAPILLARY: 376 mg/dL — AB (ref 65–99)
Glucose-Capillary: 166 mg/dL — ABNORMAL HIGH (ref 65–99)
Glucose-Capillary: 226 mg/dL — ABNORMAL HIGH (ref 65–99)
Glucose-Capillary: 297 mg/dL — ABNORMAL HIGH (ref 65–99)
Glucose-Capillary: 396 mg/dL — ABNORMAL HIGH (ref 65–99)

## 2015-09-30 MED ORDER — INSULIN ASPART 100 UNIT/ML ~~LOC~~ SOLN
5.0000 [IU] | Freq: Three times a day (TID) | SUBCUTANEOUS | Status: DC
  Administered 2015-09-30 – 2015-10-04 (×13): 5 [IU] via SUBCUTANEOUS

## 2015-09-30 MED ORDER — AZITHROMYCIN 250 MG PO TABS
500.0000 mg | ORAL_TABLET | ORAL | Status: DC
  Administered 2015-09-30 – 2015-10-03 (×4): 500 mg via ORAL
  Filled 2015-09-30 (×4): qty 1
  Filled 2015-09-30: qty 2

## 2015-09-30 MED ORDER — INSULIN GLARGINE 100 UNIT/ML ~~LOC~~ SOLN
50.0000 [IU] | Freq: Every day | SUBCUTANEOUS | Status: DC
  Administered 2015-10-01 – 2015-10-02 (×2): 50 [IU] via SUBCUTANEOUS
  Filled 2015-09-30 (×2): qty 0.5

## 2015-09-30 MED ORDER — INSULIN GLARGINE 100 UNIT/ML ~~LOC~~ SOLN
10.0000 [IU] | Freq: Once | SUBCUTANEOUS | Status: AC
  Administered 2015-09-30: 10 [IU] via SUBCUTANEOUS
  Filled 2015-09-30: qty 0.1

## 2015-09-30 NOTE — Care Management Note (Signed)
Case Management Note  Patient Details  Name: Marletta LorMary N Edmister MRN: [id number] Date of Birth: 10/06/1940  Subjective/Objective:   Patient admitted with Lt sided weakness but has a negative MRI. Pt found to have UTI and pneumonia. Patient is from home with her husband.                 Action/Plan: Awaiting PT/OT recommendations. CM will continue to follow for discharge needs.   Expected Discharge Date:                  Expected Discharge Plan:     In-House Referral:     Discharge planning Services     Post Acute Care Choice:    Choice offered to:     DME Arranged:    DME Agency:     HH Arranged:    HH Agency:     Status of Service:  In process, will continue to follow  Medicare Important Message Given:    Date Medicare IM Given:    Medicare IM give by:    Date Additional Medicare IM Given:    Additional Medicare Important Message give by:     If discussed at Long Length of Stay Meetings, dates discussed:    Additional Comments:  Kermit BaloKelli F Reyansh Kushnir, RN 09/30/2015, 11:26 AM

## 2015-09-30 NOTE — Progress Notes (Signed)
  Echocardiogram 2D Echocardiogram has been performed.  Arvil ChacoFoster, Daniyal Tabor 09/30/2015, 12:55 PM

## 2015-09-30 NOTE — Progress Notes (Signed)
Inpatient Diabetes Program Recommendations  AACE/ADA: New Consensus Statement on Inpatient Glycemic Control (2015)  Target Ranges:  Prepandial:   less than 140 mg/dL      Peak postprandial:   less than 180 mg/dL (1-2 hours)      Critically ill patients:  140 - 180 mg/dL   Review of Glycemic Control: Results for Marletta LorOE, Shamariah N (MRN [id number]) as of 09/30/2015 12:24  Ref. Range 09/29/2015 07:40 09/29/2015 11:21 09/29/2015 16:16 09/29/2015 19:52 09/30/2015 00:11 09/30/2015 03:27 09/30/2015 08:08  Glucose-Capillary Latest Ref Range: 65-99 mg/dL 409229 (H) 811232 (H) 914361 (H) 382 (H) 241 (H) 166 (H) 226 (H)    Diabetes history: Diabetes Mellitus Outpatient Diabetes medications: Toujeo 40 units daily, Metformin 1000 mg bid Current orders for Inpatient glycemic control:  Lantus 40 units daily, Novolog moderate q 4 hours-Solumedrol 60 mg IV q 8 hours  Inpatient Diabetes Program Recommendations:   Note CBG's likely increased from IV steroids. May consider adding Novolog meal coverage 4 units tid with meals-Hold if patient eats less than 50%.  A1C pending.  Thanks, Beryl MeagerJenny Moises Terpstra, RN, BC-ADM Inpatient Diabetes Coordinator Pager [phone] (8a-5p)

## 2015-09-30 NOTE — Progress Notes (Signed)
Occupational Therapy Evaluation Patient Details Name: [name] MRN: [id number] DOB: 03-07-1941 Today's Date: 09/30/2015    History of Present Illness Pt is a 74 y/o female with a history of diabetes, HTN, depression, anxiety who presented to the ED with left-sided weakness, left facial weakness (improved since seen in ED per notes), subjective fevers, generalized weakness, dysuria and altered mental status. Head CT which was done was unremarkable. Per MD notes, likely metabolic encephalopathy secondary to community acquired pneumonia and urinary tract infection.   Clinical Impression   PTA, pt completed most ADLs and mobility independently with assistance from husband for lower body dressing. Pt currently presents with decreased activity tolerance and balance impairments and required min-min guard assist for ADLs and mobility for safety. Pt will benefit from acute skilled OT to increase independence and safety with ADLs and mobility to allow for safe discharge home with assistance from family as needed. No OT follow up or DME recommendations at this time.     Follow Up Recommendations  No OT follow up;Supervision - Intermittent    Equipment Recommendations  None recommended by OT    Recommendations for Other Services       Precautions / Restrictions Precautions Precautions: Fall Restrictions Weight Bearing Restrictions: No      Mobility Bed Mobility Overal bed mobility: Needs Assistance Bed Mobility: Sit to Supine;Supine to Sit     Supine to sit: Min assist Sit to supine: Min guard   General bed mobility comments: HOB flat, use of bedrails and hand held assist to come to sitting.   Transfers Overall transfer level: Needs assistance Equipment used: Rolling walker (2 wheeled) Transfers: Sit to/from Stand Sit to Stand: Min guard         General transfer comment: VC's on seated surface for safety and proper RW use during ambulation    Balance Overall balance  assessment: Needs assistance Sitting-balance support: No upper extremity supported;Feet supported Sitting balance-Leahy Scale: Poor Sitting balance - Comments: Required at least 1 UE on bedrail to maintain upright posture   Standing balance support: Bilateral upper extremity supported;During functional activity Standing balance-Leahy Scale: Poor                              ADL Overall ADL's : Needs assistance/impaired     Grooming: Wash/dry face;Oral care;Brushing hair;Min guard;Standing           Upper Body Dressing : Set up;Sitting   Lower Body Dressing: Moderate assistance;Sitting/lateral leans Lower Body Dressing Details (indicate cue type and reason): to don/doff socks Toilet Transfer: Min guard;Ambulation;Regular Toilet;RW;Grab bars   Toileting- Clothing Manipulation and Hygiene: Min guard;Sit to/from stand       Functional mobility during ADLs: Min guard;Rolling walker General ADL Comments: Pt out of breath after ~5 minutes of standing. Bilateral hand tremors noted during functional tasks. Verbal cues for safety and proper RW use.     Vision Vision Assessment?: Yes Eye Alignment: Within Functional Limits Ocular Range of Motion: Within Functional Limits Alignment/Gaze Preference: Within Defined Limits Tracking/Visual Pursuits: Decreased smoothness of horizontal tracking;Decreased smoothness of vertical tracking;Requires cues, head turns, or add eye shifts to track Convergence: Impaired (comment) (minimal eye movement observed) Visual Fields: Other (comment) (Superior visual field deficit (~20 degrees)) Depth Perception: Undershoots Additional Comments: Pt unable to track object moving from L-R side once object reached midline - pt's eyes would jump to R side and pt required verbal cues to locate object again  Perception     Praxis      Pertinent Vitals/Pain Pain Assessment: No/denies pain     Hand Dominance Right   Extremity/Trunk Assessment  Upper Extremity Assessment Upper Extremity Assessment: Generalized weakness   Lower Extremity Assessment Lower Extremity Assessment: Defer to PT evaluation   Cervical / Trunk Assessment Cervical / Trunk Assessment: Normal;Other exceptions Cervical / Trunk Exceptions: Forward head posture with rounded shoulders   Communication Communication Communication: No difficulties   Cognition Arousal/Alertness: Awake/alert Behavior During Therapy: WFL for tasks assessed/performed Overall Cognitive Status: Within Functional Limits for tasks assessed                     General Comments       Exercises       Shoulder Instructions      Home Living Family/patient expects to be discharged to:: Private residence Living Arrangements: Spouse/significant other Available Help at Discharge: Family;Available 24 hours/day Type of Home: House Home Access: Level entry     Home Layout: Two level Alternate Level Stairs-Number of Steps: 10 - 5 steps, landing, 5 steps Alternate Level Stairs-Rails: Right Bathroom Shower/Tub: Tub/shower unit Shower/tub characteristics: Curtain FirefighterBathroom Toilet: Standard     Home Equipment: Environmental consultantWalker - 2 wheels;Cane - single point;Wheelchair - [employer] Corporationmanual;Walker - 4 wheels;Bedside commode;Shower seat;Hand held shower head          Prior Functioning/Environment Level of Independence: Needs assistance    ADL's / Homemaking Assistance Needed: Assistance from husband for LB dressing        OT Diagnosis: Generalized weakness   OT Problem List: Decreased strength;Decreased activity tolerance;Impaired balance (sitting and/or standing);Decreased range of motion;Decreased coordination;Decreased safety awareness;Decreased knowledge of use of DME or AE;Obesity   OT Treatment/Interventions: Self-care/ADL training;Therapeutic exercise;Energy conservation;DME and/or AE instruction;Therapeutic activities;Patient/family education;Balance training    OT Goals(Current goals  can be found in the care plan section) Acute Rehab OT Goals Patient Stated Goal: Get back to normal OT Goal Formulation: With patient Time For Goal Achievement: 10/14/15 Potential to Achieve Goals: Good ADL Goals Pt Will Perform Lower Body Bathing: with modified independence;with adaptive equipment;sitting/lateral leans;sit to/from stand Pt Will Perform Lower Body Dressing: with modified independence;with adaptive equipment;sitting/lateral leans;sit to/from stand Pt Will Transfer to Toilet: with modified independence;ambulating;regular height toilet Pt Will Perform Toileting - Clothing Manipulation and hygiene: with modified independence;sitting/lateral leans;sit to/from stand Pt Will Perform Tub/Shower Transfer: Tub transfer;with supervision;ambulating;rolling walker  OT Frequency: Min 2X/week   Barriers to D/C:            Co-evaluation              End of Session Equipment Utilized During Treatment: Gait belt;Rolling walker;Oxygen Nurse Communication: Mobility status  Activity Tolerance: Patient limited by fatigue Patient left: in bed;with call bell/phone within reach;with bed alarm set;with family/visitor present   Time: [id number] OT Time Calculation (min): 27 min Charges:  OT General Charges $OT Visit: 1 Procedure OT Evaluation $Initial OT Evaluation Tier I: 1 Procedure OT Treatments $Self Care/Home Management : 8-22 mins G-Codes:    Nils PyleJulia Quintyn Allison, OTR/L 09/30/2015, 3:53 PM Pager: [phone]

## 2015-09-30 NOTE — Progress Notes (Signed)
*  PRELIMINARY RESULTS* Vascular Ultrasound Carotid Duplex (Doppler) has been completed.  Preliminary findings: Bilateral: No significant (1-39%) ICA stenosis. Antegrade vertebral flow.   Farrel DemarkJill Eunice, RDMS, RVT  09/30/2015, 4:36 PM

## 2015-09-30 NOTE — Progress Notes (Signed)
OT Cancellation Note  Patient Details Name: [name] MRN: [id number] DOB: 10/14/1940   Cancelled Treatment:    Reason Eval/Treat Not Completed: Patient at procedure or test/ unavailable (Echo). Will attempt to see later today if time allows.  Nils PyleJulia Guillaume Weninger, OTR/L Pager: [phone] 09/30/2015, 12:29 PM

## 2015-09-30 NOTE — Evaluation (Signed)
Physical Therapy Evaluation Patient Details Name: [name] MRN: [id number] DOB: 05/28/1941 Today's Date: 09/30/2015   History of Present Illness  Pt is a 74 y/o female with a history of diabetes, HTN, depression, anxiety who presented to the ED with left-sided weakness, left facial weakness (improved since seen in ED per notes), subjective fevers, generalized weakness, dysuria and altered mental status. Head CT which was done was unremarkable. Per MD notes, likely metabolic encephalopathy secondary to community acquired pneumonia and urinary tract infection.  Clinical Impression  Pt admitted with above diagnosis. Pt currently with functional limitations due to the deficits listed below (see PT Problem List). At the time of PT eval pt was able to perform transfers and ambulation at a min guard level. Pt will benefit from skilled PT to increase their independence and safety with mobility to allow discharge to the venue listed below.    Of note: At rest on 2L/min supplemental O2 sats at 98%. During ambulation to the chair (15') on RA, sats dropped to 87%, with a further drop to 82% once seated. Supplemental O2 was again donned and sats returned to >90% before end of session.      Follow Up Recommendations Home health PT;Supervision for mobility/OOB    Equipment Recommendations  None recommended by PT    Recommendations for Other Services       Precautions / Restrictions Precautions Precautions: Fall Restrictions Weight Bearing Restrictions: No      Mobility  Bed Mobility Overal bed mobility: Needs Assistance Bed Mobility: Supine to Sit     Supine to sit: Min guard     General bed mobility comments: Close guard for safety as pt transitioned to EOB. Pt with heavy use of bed rails  Transfers Overall transfer level: Needs assistance Equipment used: Rolling walker (2 wheeled) Transfers: Sit to/from Stand Sit to Stand: Min guard         General transfer comment: VC's on  seated surface for safety.  Ambulation/Gait Ambulation/Gait assistance: Min assist Ambulation Distance (Feet): 15 Feet Assistive device: Rolling walker (2 wheeled) Gait Pattern/deviations: Step-through pattern;Decreased stride length;Staggering left Gait velocity: Decreased Gait velocity interpretation: Below normal speed for age/gender General Gait Details: Pt appeared to have difficulty maneuvering walker around turns and required occasional assist due to some LOB to the L side.   Stairs            Wheelchair Mobility    Modified Rankin (Stroke Patients Only)       Balance Overall balance assessment: Needs assistance Sitting-balance support: Feet supported;No upper extremity supported Sitting balance-Leahy Scale: Poor Sitting balance - Comments: Pt was not able to reach down to get underwear on.   Standing balance support: Bilateral upper extremity supported;During functional activity Standing balance-Leahy Scale: Poor                               Pertinent Vitals/Pain Pain Assessment: No/denies pain    Home Living Family/patient expects to be discharged to:: Private residence Living Arrangements: Spouse/significant other Available Help at Discharge: Family;Available 24 hours/day Type of Home: House Home Access: Level entry     Home Layout: Two level Home Equipment: Walker - 2 wheels;Cane - single point;Shower seat      Prior Function Level of Independence: Independent         Comments: Questionable if husband assists with LB dressing     Hand Dominance  Extremity/Trunk Assessment   Upper Extremity Assessment: Defer to OT evaluation           Lower Extremity Assessment: Generalized weakness      Cervical / Trunk Assessment: Normal;Other exceptions  Communication   Communication: No difficulties  Cognition Arousal/Alertness: Awake/alert Behavior During Therapy: WFL for tasks assessed/performed Overall Cognitive  Status: Within Functional Limits for tasks assessed                      General Comments      Exercises        Assessment/Plan    PT Assessment Patient needs continued PT services  PT Diagnosis Difficulty walking   PT Problem List Decreased strength;Decreased range of motion;Decreased activity tolerance;Decreased balance;Decreased mobility;Decreased knowledge of use of DME;Decreased safety awareness;Decreased knowledge of precautions;Cardiopulmonary status limiting activity  PT Treatment Interventions DME instruction;Stair training;Gait training;Functional mobility training;Therapeutic activities;Therapeutic exercise;Neuromuscular re-education;Patient/family education   PT Goals (Current goals can be found in the Care Plan section) Acute Rehab PT Goals Patient Stated Goal: Get back to normal PT Goal Formulation: With patient Time For Goal Achievement: 10/07/15 Potential to Achieve Goals: Good    Frequency Min 3X/week   Barriers to discharge        Co-evaluation               End of Session Equipment Utilized During Treatment: Gait belt;Oxygen Activity Tolerance: Patient limited by fatigue Patient left: in chair;with call bell/phone within reach;with chair alarm set;with family/visitor present Nurse Communication: Mobility status         Time: 6962-9528 PT Time Calculation (min) (ACUTE ONLY): 25 min   Charges:   PT Evaluation $Initial PT Evaluation Tier I: 1 Procedure PT Treatments $Gait Training: 8-22 mins   PT G Codes:        Conni Slipper October 11, 2015, 1:03 PM  Conni Slipper, PT, DPT Acute Rehabilitation Services Pager: [phone]

## 2015-09-30 NOTE — Progress Notes (Signed)
Patient and family say that [employer] does not work for patient and are requesting that she be given [employer] which she takes at home.

## 2015-09-30 NOTE — Progress Notes (Signed)
[facility] PROGRESS NOTE  TENNILLE MONTELONGO ZOX:[id number] DOB: Mar 09, 1941 DOA: 09/28/2015  PCP: Ginette Otto, MD  Brief HPI: 74 year old Caucasian female with a past medical history of diabetes, hypertension, depression, presented to the emergency department with fever, weakness, with some concern for left-sided weakness as well. She had been complaining of dysuria. She was also noted to be confused. Evaluation in the ED revealed community-acquired pneumonia and a UTI. Left-sided weakness subsided. MRI was negative for stroke.  Past medical history:  Past Medical History  Diagnosis Date  . Diabetes mellitus without complication (HCC)   . Hypertension     Consultants: None  Procedures:  Echocardiogram Study Conclusions - Left ventricle: The cavity size was normal. Systolic function wasnormal. The estimated ejection fraction was in the range of 60%to 65%. Wall motion was normal; there were no regional wallmotion abnormalities. - Mitral valve: There was mild regurgitation. - Left atrium: The atrium was mildly to moderately dilated. - Pulmonary arteries: Systolic pressure was mildly increased. PApeak pressure: 37 mm Hg (S).  Antibiotics: Ceftriaxone and azithromycin 12/24  Subjective: Patient feels like she is improving. Continues to have wheezing. However, overall her breathing feels better. Denies any nausea, vomiting. Continues to have a cough with yellow expectoration.   Objective: Vital Signs  Filed Vitals:   09/29/15 2126 09/29/15 2153 09/30/15 0108 09/30/15 0554  BP: 100/59 119/42 110/47 122/54  Pulse: 78 102 95 81  Temp: 99.3 F (37.4 C) 98.3 F (36.8 C) 98.8 F (37.1 C) 97.8 F (36.6 C)  TempSrc: Oral Oral Oral Oral  Resp: Height:      Weight:      SpO2: 100% 94% 95% 95%    Intake/Output Summary (Last 24 hours) at 09/30/15 0944 Last data filed at 09/29/15 1800  Gross per 24 hour  Intake    480 ml  Output    750 ml  Net   -270 ml     Filed Weights   09/28/15 1800  Weight: 85.1 kg (187 lb 9.8 oz)    General appearance: alert, cooperative, appears stated age, no distress and moderately obese Resp: Slightly improved air entry bilaterally. End expiratory wheezing bilaterally with few rhonchi. Crackles at the bases. Cardio: regular rate and rhythm, S1, S2 normal, no murmur, click, rub or gallop GI: soft, non-tender; bowel sounds normal; no masses,  no organomegaly Extremities: extremities normal, atraumatic, no cyanosis or edema Neurologic: She is alert and oriented 3. No facial asymmetry. Motor strength equal bilateral upper extremities. Somewhat weak in the left leg which is chronic per patient.  Lab Results:  Basic Metabolic Panel:  Recent Labs Lab 09/28/15 0930 09/28/15 1400 09/29/15 0508 09/29/15 1426 09/30/15 0555  NA 139  --   --  138 137  K 4.2  --   --  3.9 4.4  CL 101  --   --  105 102  CO2 27  --   --  26 26  GLUCOSE 215*  --   --  308* 192*  BUN 19  --   --  19 21*  CREATININE 1.13*  --   --  0.95 0.95  CALCIUM 9.1  --   --  8.6* 8.5*  MG  --  1.5* 1.8  --   --   PHOS  --  3.4  --   --   --    Liver Function Tests:  Recent Labs Lab 09/28/15 0930 09/29/15 1426  AST 16 21  ALT  19 22  ALKPHOS 74 64  BILITOT 0.6 0.5  PROT 7.9 7.0  ALBUMIN 3.5 2.7*    Recent Labs Lab 09/28/15 0930  AMMONIA 34   CBC:  Recent Labs Lab 09/28/15 0930 09/29/15 1426 09/30/15 0555  WBC 8.5 5.9 8.0  NEUTROABS 5.1  --   --   HGB 13.1 11.8* 11.5*  HCT 43.0 39.1 37.9  MCV 83.7 82.0 81.9  PLT 187 209 215   Cardiac Enzymes:  Recent Labs Lab 09/28/15 1400  CKTOTAL 79   BNP (last 3 results)  Recent Labs  09/28/15 1400  BNP 68.5    CBG:  Recent Labs Lab 09/29/15 1616 09/29/15 1952 09/30/15 0011 09/30/15 0327 09/30/15 0808  GLUCAP 361* 382* 241* 166* 226*    Recent Results (from the past 240 hour(s))  Urine culture     Status: None (Preliminary result)   Collection Time:  09/28/15  9:58 AM  Result Value Ref Range Status   Specimen Description URINE, CLEAN CATCH  Final   Special Requests NONE  Final   Culture >=100,000 COLONIES/mL ESCHERICHIA COLI  Final   Report Status PENDING  Incomplete   Organism ID, Bacteria ESCHERICHIA COLI  Final      Susceptibility   Escherichia coli - MIC*    AMPICILLIN <=2 SENSITIVE Sensitive     CEFAZOLIN <=4 SENSITIVE Sensitive     CEFTRIAXONE <=1 SENSITIVE Sensitive     CIPROFLOXACIN <=0.25 SENSITIVE Sensitive     GENTAMICIN <=1 SENSITIVE Sensitive     IMIPENEM <=0.25 SENSITIVE Sensitive     NITROFURANTOIN <=16 SENSITIVE Sensitive     TRIMETH/SULFA <=20 SENSITIVE Sensitive     AMPICILLIN/SULBACTAM <=2 SENSITIVE Sensitive     PIP/TAZO <=4 SENSITIVE Sensitive     * >=100,000 COLONIES/mL ESCHERICHIA COLI  Blood culture (routine x 2)     Status: None (Preliminary result)   Collection Time: 09/28/15  1:50 PM  Result Value Ref Range Status   Specimen Description BLOOD RIGHT ANTECUBITAL  Final   Special Requests BOTTLES DRAWN AEROBIC AND ANAEROBIC  Final   Culture NO GROWTH 1 DAY  Final   Report Status PENDING  Incomplete  Blood culture (routine x 2)     Status: None (Preliminary result)   Collection Time: 09/28/15  2:00 PM  Result Value Ref Range Status   Specimen Description BLOOD RIGHT HAND  Final   Special Requests BOTTLES DRAWN AEROBIC AND ANAEROBIC  Final   Culture NO GROWTH 1 DAY  Final   Report Status PENDING  Incomplete  Culture, sputum-assessment     Status: None   Collection Time: 09/28/15  5:12 PM  Result Value Ref Range Status   Specimen Description SPUTUM  Final   Special Requests NONE  Final   Sputum evaluation   Final    MICROSCOPIC FINDINGS SUGGEST THAT THIS SPECIMEN IS NOT REPRESENTATIVE OF LOWER RESPIRATORY SECRETIONS. PLEASE RECOLLECT. Gram Stain Report Called to,Read Back By and Verified With: H. MCINTOSH,RN AT 1308 ON [id number] BY Lucienne Capers    Report Status 09/29/2015 FINAL  Final       Studies/Results: Ct Head Wo Contrast  09/28/2015  CLINICAL DATA:  Status post fall, altered mental status. EXAM: CT HEAD WITHOUT CONTRAST TECHNIQUE: Contiguous axial images were obtained from the base of the skull through the vertex without intravenous contrast. COMPARISON:  October 21, 2012 FINDINGS: There is no midline shift, hydrocephalus, or mass. No acute hemorrhage or acute transcortical infarct is identified. There is chronic diffuse atrophy.  The bony calvarium is intact. The visualized sinuses are clear. IMPRESSION: No focal acute intracranial abnormality identified. Chronic diffuse atrophy. Electronically Signed   By: Sherian ReinWei-Chen  Lin M.D.   On: 09/28/2015 10:02   Mr Maxine GlennMra Head Wo Contrast  09/28/2015  CLINICAL DATA:  New onset left-sided weakness. Left facial weakness. EXAM: MRI HEAD WITHOUT CONTRAST MRA HEAD WITHOUT CONTRAST TECHNIQUE: Multiplanar, multiecho pulse sequences of the brain and surrounding structures were obtained without intravenous contrast. Angiographic images of the head were obtained using MRA technique without contrast. COMPARISON:  CT head without contrast from the same day. FINDINGS: MRI HEAD FINDINGS The diffusion-weighted images demonstrate no evidence for acute or subacute infarct. There is no acute hemorrhage or mass lesion. The study is mildly degraded by patient motion. Moderate periventricular and subcortical T2 changes are somewhat advanced for age. Moderate T2 changes are present within the central pons. Remote lacunar infarcts are evident in the inferior right cerebellum. The internal auditory canals are within normal limits. Flow is present in the major intracranial arteries. Bilateral lens replacements are present. The paranasal sinuses and mastoid air cells are clear. MRA HEAD FINDINGS The study is moderately degraded by patient motion, particularly in the upper portions. The high cervical internal carotid arteries are within normal limits bilaterally. There may  be some narrowing of the cavernous left internal carotid arteries. The A1 and M1 segments appear to be intact. The MCA bifurcations are intact. The branch vessels are severely degraded by motion. The left vertebral artery is slightly dominant to the right. Mild narrowing is suspected at the left vertebral artery dural margin. The PICA origins are visualized and normal bilaterally. The basilar artery is normal. The left posterior cerebral artery originates from the basilar tip. The right posterior cerebral artery is of fetal type. IMPRESSION: 1. No acute or subacute infarct to explain the patient's symptoms. 2. Moderate periventricular and subcortical white matter changes likely reflect the sequela of chronic microvascular ischemia. 3. Moderate white matter changes are noted in the pons is well. 4. Remote lacunar infarct of the right cerebellum. 5. Probable mild narrowing of the left vertebral artery at the dural margin in the cavernous left internal carotid artery. 6. The MRA is severely degraded by patient motion in evaluation of distal vessels is not possible. Electronically Signed   By: Marin Robertshristopher  Mattern M.D.   On: 09/28/2015 17:04   Mr Brain Wo Contrast  09/28/2015  CLINICAL DATA:  New onset left-sided weakness. Left facial weakness. EXAM: MRI HEAD WITHOUT CONTRAST MRA HEAD WITHOUT CONTRAST TECHNIQUE: Multiplanar, multiecho pulse sequences of the brain and surrounding structures were obtained without intravenous contrast. Angiographic images of the head were obtained using MRA technique without contrast. COMPARISON:  CT head without contrast from the same day. FINDINGS: MRI HEAD FINDINGS The diffusion-weighted images demonstrate no evidence for acute or subacute infarct. There is no acute hemorrhage or mass lesion. The study is mildly degraded by patient motion. Moderate periventricular and subcortical T2 changes are somewhat advanced for age. Moderate T2 changes are present within the central pons.  Remote lacunar infarcts are evident in the inferior right cerebellum. The internal auditory canals are within normal limits. Flow is present in the major intracranial arteries. Bilateral lens replacements are present. The paranasal sinuses and mastoid air cells are clear. MRA HEAD FINDINGS The study is moderately degraded by patient motion, particularly in the upper portions. The high cervical internal carotid arteries are within normal limits bilaterally. There may be some narrowing of the cavernous left  internal carotid arteries. The A1 and M1 segments appear to be intact. The MCA bifurcations are intact. The branch vessels are severely degraded by motion. The left vertebral artery is slightly dominant to the right. Mild narrowing is suspected at the left vertebral artery dural margin. The PICA origins are visualized and normal bilaterally. The basilar artery is normal. The left posterior cerebral artery originates from the basilar tip. The right posterior cerebral artery is of fetal type. IMPRESSION: 1. No acute or subacute infarct to explain the patient's symptoms. 2. Moderate periventricular and subcortical white matter changes likely reflect the sequela of chronic microvascular ischemia. 3. Moderate white matter changes are noted in the pons is well. 4. Remote lacunar infarct of the right cerebellum. 5. Probable mild narrowing of the left vertebral artery at the dural margin in the cavernous left internal carotid artery. 6. The MRA is severely degraded by patient motion in evaluation of distal vessels is not possible. Electronically Signed   By: Marin Roberts M.D.   On: 09/28/2015 17:04   Mr Lumbar Spine Wo Contrast  09/28/2015  CLINICAL DATA:  Rolled out of bed onto floor.  Unable to use legs. EXAM: MRI LUMBAR SPINE WITHOUT CONTRAST TECHNIQUE: Multiplanar, multisequence MR imaging of the lumbar spine was performed. No intravenous contrast was administered. COMPARISON:  MRI lumbar spine 03/27/2009  FINDINGS: Normal signal is present in the conus medullaris which terminates at L1-2. A prominent hemangioma is again noted at L1. A smaller hemangioma is present at T12. Chronic endplate marrow changes are again noted at L5-S1. Levoconvex curvature is present at thoracolumbar junction. Limiting imaging of the abdomen demonstrates no focal lesions. There is no significant adenopathy. L1-2:  Minimal disc bulging is present without significant stenosis. L2-3:  Negative. L3-4: A broad-based disc protrusion is present. There is progression of mild bilateral subarticular stenosis. The foramina are patent. L4-5: Mild disc bulging and facet hypertrophy are stable. There is no significant stenosis. L5-S1: A left laminectomy is again noted. There is progression of a right paramedian disc protrusion. Mild subarticular narrowing is worse right than left. The foramina are patent. IMPRESSION: 1. Progression of broad-based disc protrusion and mild bilateral subarticular stenosis at L3-4. 2. Similar appearance of mild disc bulging and facet hypertrophy at L4-5 without significant stenosis. 3. Progressive right paramedian disc protrusion with mild subarticular narrowing bilaterally, right greater than left. Electronically Signed   By: Marin Roberts M.D.   On: 09/28/2015 17:18   Dg Chest Port 1 View  09/28/2015  CLINICAL DATA:  Wheezing and decreased oxygen saturation. EXAM: PORTABLE CHEST 1 VIEW COMPARISON:  05/01/2015 FINDINGS: Cardiomediastinal silhouette is normal. Mediastinal contours appear intact. There is low lung volume with coarsening of the interstitial markings. Patchy airspace consolidation is seen in the right lower lobe. Osseous structures are without acute abnormality. Soft tissues are grossly normal. IMPRESSION: Low lung volumes with coarsening of the interstitial markings, which may represent developing interstitial edema. Patchy airspace consolidation in the right lower lobe. Given patient's history  aspiration pneumonia is of consideration. Electronically Signed   By: Ted Mcalpine M.D.   On: 09/28/2015 10:01    Medications:  Scheduled: . arformoterol  15 mcg Nebulization BID  . aspirin  300 mg Rectal Daily   Or  . aspirin  325 mg Oral Daily  . aspirin  300 mg Rectal Daily  . atorvastatin  10 mg Oral Daily  . azithromycin  500 mg Intravenous Q24H  . budesonide (PULMICORT) nebulizer solution  0.25 mg Nebulization  BID  . cefTRIAXone (ROCEPHIN)  IV  1 g Intravenous Q24H  . DULoxetine  60 mg Oral Daily  . enoxaparin (LOVENOX) injection  40 mg Subcutaneous Q24H  . fluticasone  2 spray Each Nare Daily  . gabapentin  400 mg Oral TID  . insulin aspart  0-15 Units Subcutaneous 6 times per day  . insulin glargine  40 Units Subcutaneous Daily  . ipratropium-albuterol  3 mL Nebulization TID  . loratadine  10 mg Oral Daily  . methylPREDNISolone (SOLU-MEDROL) injection  60 mg Intravenous 3 times per day   Continuous:  ZOX:[id number], ALPRAZolam  Assessment/Plan:  Principal Problem:   Left-sided weakness Active Problems:   Anxiety disorder   Peripheral neuropathy (HCC)   Asthma   Metabolic encephalopathy   Acute respiratory failure with hypoxia (HCC)   CAP (community acquired pneumonia)   Diabetes mellitus, insulin dependent (IDDM), uncontrolled (HCC)   HTN (hypertension)   Acute UTI   Left leg weakness     Acute respiratory failure with hypoxia/CAP vs asp pNA Chest x-ray revealed evidence for pneumonia. Continue antibiotics intravenously for now. Oxygen as needed. Seen by speech therapist and is on a regular diet now. Echocardiogram report reviewed. EF noted to be normal.  Metabolic encephalopathy Likely secondary to UTI and pneumonia. Mental status appears to be improved. Continue to monitor for now.  Urinary tract infection with Escherichia coli Sensitivities noted. Continue current antibiotics.  Questionable Left-sided weakness Patient does have chronic left  lower extremity weakness. No deficits noted in the left upper extremity. MRI was negative for stroke. MRI of the lumbar spine did show some disc protrusion, however, no concerning findings noted. PT and OT evaluation. Cleared by speech therapist. It is quite likely that her infectious process may have caused encephalopathy and weakness, which could have been misinterpreted as stroke like symptoms. Await carotid Dopplers.  History of Asthma with mild exacerbation Continue nebulizer treatments, steroids.  History of Peripheral neuropathy  Continue home medications  Anxiety disorder Continue home medications.  Diabetes mellitus, insulin dependent (IDDM), uncontrolled  Blood sugars running high, most likely due to steroids. Increase dose of Lantus. HbA1c is pending.  Essential hypertension   Blood pressure is reasonably well controlled. Her antihypertensives are on hold for now.  DVT Prophylaxis: Lovenox    Code Status: DO NOT RESUSCITATE  Family Communication: Discussed with the patient. No family at bedside  Disposition Plan: Continue current treatment. PT and OT to evaluate.    LOS: 1 day   [facility]  [facility] Pager [phone] 09/30/2015, 9:44 AM  If 7PM-7AM, please contact night-coverage at [email or web address], password [facility]

## 2015-09-30 NOTE — Progress Notes (Signed)
RT Note - Sat on 1 Lpm 91%. Increased to 2 Lpm and sat improved to 92%. RN notified.

## 2015-09-30 NOTE — Progress Notes (Signed)
Speech Language Pathology Treatment: Dysphagia  Patient Details Name: NEYA CREEGAN MRN: [id number] DOB: 04/23/1941 Today's Date: 09/30/2015 Time: 1416-1430 SLP Time Calculation (min) (ACUTE ONLY): 14 min  Assessment / Plan / Recommendation Clinical Impression  Treatment focused on diet tolerance. Po trials provided in which patient able to consume without overt indication of dysphagia or evidence of aspiration. Family present and confirm no h/o dysphagia or recurrent PNAs to suggest chronic aspiration. No f/u SLP services needed at this time. If right sided PNAs become recurrent in nature, may wish to consider MBS. Education provided regarding above with family.    HPI HPI: 74 year old female history of diabetes, hypertension, depression, anxiety who presented to the ED with left-sided weakness, left facial weakness (improved since seen in ED per notes), subjective fevers, generalized weakness, dysuria and altered mental status.  Head CT which was done was unremarkable. Per MD notes, likely metabolic encephalopathy secondary to community acquired pneumonia and urinary tract infection. CXR: Patchy airspace consolidation right lower lobe. Given patient's reported history aspiration pneumonia is of consideration. H/o esophagram with small hiatal hernia.       SLP Plan  All goals met     Recommendations  Diet recommendations: Regular;Thin liquid Liquids provided via: Cup;Straw Medication Administration: Whole meds with liquid Supervision: Patient able to self feed Compensations: Slow rate;Small sips/bites Postural Changes and/or Swallow Maneuvers: Seated upright 90 degrees              Oral Care Recommendations: Oral care BID Follow up Recommendations: None Plan: All goals met  Gabriel Rainwater MA, CCC-SLP [phone]  Sharonna Vinje Meryl 09/30/2015, 2:42 PM

## 2015-10-01 ENCOUNTER — Inpatient Hospital Stay (HOSPITAL_COMMUNITY): Payer: Medicare Other

## 2015-10-01 LAB — BASIC METABOLIC PANEL
Anion gap: 6 (ref 5–15)
BUN: 23 mg/dL — ABNORMAL HIGH (ref 6–20)
CALCIUM: 8.7 mg/dL — AB (ref 8.9–10.3)
CHLORIDE: 106 mmol/L (ref 101–111)
CO2: 29 mmol/L (ref 22–32)
CREATININE: 0.9 mg/dL (ref 0.44–1.00)
Glucose, Bld: 134 mg/dL — ABNORMAL HIGH (ref 65–99)
Potassium: 4.7 mmol/L (ref 3.5–5.1)
SODIUM: 141 mmol/L (ref 135–145)

## 2015-10-01 LAB — URINE CULTURE

## 2015-10-01 LAB — BLOOD GAS, ARTERIAL
ACID-BASE EXCESS: 3.6 mmol/L — AB (ref 0.0–2.0)
BICARBONATE: 29.1 meq/L — AB (ref 20.0–24.0)
DRAWN BY: 295031
O2 CONTENT: 2 L/min
O2 Saturation: 95.1 %
PATIENT TEMPERATURE: 98.6
PCO2 ART: 57.3 mmHg — AB (ref 35.0–45.0)
PH ART: 7.326 — AB (ref 7.350–7.450)
TCO2: 30.9 mmol/L (ref 0–100)
pO2, Arterial: 77.7 mmHg — ABNORMAL LOW (ref 80.0–100.0)

## 2015-10-01 LAB — RESPIRATORY VIRUS PANEL
ADENOVIRUS: NEGATIVE
INFLUENZA B 1: NEGATIVE
Influenza A: NEGATIVE
METAPNEUMOVIRUS: NEGATIVE
Parainfluenza 1: NEGATIVE
Parainfluenza 2: NEGATIVE
Parainfluenza 3: NEGATIVE
RESPIRATORY SYNCYTIAL VIRUS A: NEGATIVE
RHINOVIRUS: NEGATIVE
Respiratory Syncytial Virus B: NEGATIVE

## 2015-10-01 LAB — HEMOGLOBIN A1C
HEMOGLOBIN A1C: 8.3 % — AB (ref 4.8–5.6)
Hgb A1c MFr Bld: 7.9 % — ABNORMAL HIGH (ref 4.8–5.6)
MEAN PLASMA GLUCOSE: 192 mg/dL
MEAN PLASMA GLUCOSE: 180 mg/dL

## 2015-10-01 LAB — GLUCOSE, CAPILLARY
GLUCOSE-CAPILLARY: 163 mg/dL — AB (ref 65–99)
Glucose-Capillary: 133 mg/dL — ABNORMAL HIGH (ref 65–99)
Glucose-Capillary: 263 mg/dL — ABNORMAL HIGH (ref 65–99)
Glucose-Capillary: 177 mg/dL — ABNORMAL HIGH (ref 65–99)
Glucose-Capillary: 150 mg/dL — ABNORMAL HIGH (ref 65–99)
Glucose-Capillary: 201 mg/dL — ABNORMAL HIGH (ref 65–99)

## 2015-10-01 LAB — LEGIONELLA PNEUMOPHILA SEROGP 1 UR AG: L. pneumophila Serogp 1 Ur Ag: NEGATIVE

## 2015-10-01 LAB — MRSA PCR SCREENING: MRSA by PCR: NEGATIVE

## 2015-10-01 MED ORDER — DILTIAZEM HCL 100 MG IV SOLR
INTRAVENOUS | Status: AC
  Filled 2015-10-01: qty 100

## 2015-10-01 MED ORDER — DILTIAZEM HCL 30 MG PO TABS: 30.0000 mg | ORAL_TABLET | Freq: Four times a day (QID) | ORAL | Status: DC

## 2015-10-01 MED ORDER — DILTIAZEM LOAD VIA INFUSION
10.0000 mg | Freq: Once | INTRAVENOUS | Status: DC | PRN
  Filled 2015-10-01: qty 10

## 2015-10-01 MED ORDER — DILTIAZEM HCL 30 MG PO TABS
60.0000 mg | ORAL_TABLET | Freq: Once | ORAL | Status: AC
  Administered 2015-10-01: 60 mg via ORAL
  Filled 2015-10-01: qty 2

## 2015-10-01 MED ORDER — IPRATROPIUM BROMIDE 0.02 % IN SOLN
0.5000 mg | RESPIRATORY_TRACT | Status: DC
  Administered 2015-10-01 – 2015-10-02 (×5): 0.5 mg via RESPIRATORY_TRACT
  Filled 2015-10-01 (×5): qty 2.5

## 2015-10-01 MED ORDER — LEVALBUTEROL HCL 0.63 MG/3ML IN NEBU
0.6300 mg | INHALATION_SOLUTION | RESPIRATORY_TRACT | Status: DC | PRN
  Administered 2015-10-01: 0.63 mg via RESPIRATORY_TRACT

## 2015-10-01 MED ORDER — IPRATROPIUM-ALBUTEROL 0.5-2.5 (3) MG/3ML IN SOLN
3.0000 mL | RESPIRATORY_TRACT | Status: DC
  Administered 2015-10-01: 3 mL via RESPIRATORY_TRACT
  Filled 2015-10-01 (×2): qty 3

## 2015-10-01 MED ORDER — LEVALBUTEROL HCL 0.63 MG/3ML IN NEBU
0.6300 mg | INHALATION_SOLUTION | RESPIRATORY_TRACT | Status: DC
  Administered 2015-10-01 – 2015-10-02 (×4): 0.63 mg via RESPIRATORY_TRACT
  Filled 2015-10-01 (×5): qty 3

## 2015-10-01 MED ORDER — DILTIAZEM HCL 100 MG IV SOLR
5.0000 mg/h | INTRAVENOUS | Status: DC
  Administered 2015-10-01 – 2015-10-02 (×2): 5 mg/h via INTRAVENOUS
  Filled 2015-10-01 (×2): qty 100

## 2015-10-01 MED ORDER — DILTIAZEM LOAD VIA INFUSION
10.0000 mg | Freq: Once | INTRAVENOUS | Status: AC
  Administered 2015-10-01: 10 mg via INTRAVENOUS

## 2015-10-01 NOTE — Progress Notes (Signed)
Occupational Therapy Treatment Patient Details Name: [name] MRN: [id number] DOB: 07/12/1941 Today's Date: 10/01/2015    History of present illness Pt is a 74 y/o female with a history of diabetes, HTN, depression, anxiety who presented to the ED with left-sided weakness, left facial weakness (improved since seen in ED per notes), subjective fevers, generalized weakness, dysuria and altered mental status. Head CT which was done was unremarkable. Per MD notes, likely metabolic encephalopathy secondary to community acquired pneumonia and urinary tract infection.   OT comments  Pt not progressing towards occupational therapy goals during session today due to increased fatigue and SOB. Educated and practiced with AE for LB ADLs and discussed energy conservation and fall prevention strategies. Change of discharge recommendations: now recommending HHOT to increase independence and safety with ADLs and functional mobility.    Follow Up Recommendations  Home health OT;Supervision - Intermittent    Equipment Recommendations  None recommended by OT    Recommendations for Other Services      Precautions / Restrictions Precautions Precautions: Fall Restrictions Weight Bearing Restrictions: No       Mobility Bed Mobility               General bed mobility comments: Pt up in chair on OT arrival  Transfers                      Balance Overall balance assessment: Needs assistance Sitting-balance support: Bilateral upper extremity supported;Feet supported Sitting balance-Leahy Scale: Poor Sitting balance - Comments: Increased anxiety and required bilateral UE support to maintain upright position in chair                           ADL Overall ADL's : Needs assistance/impaired             Lower Body Bathing: Supervison/ safety;With adaptive equipment;Sitting/lateral leans       Lower Body Dressing: Moderate assistance;Sitting/lateral leans;With adaptive  equipment                 General ADL Comments: Educated and practiced with AE for LB ADLs - pt required mod assist to complete due to increased fatigue and SOB. Discussed energy conservation and fall prevention strategies. Pt declined to participate further in session due to icnreased fatigue and SOB.      Vision                     Perception     Praxis      Cognition   Behavior During Therapy: WFL for tasks assessed/performed Overall Cognitive Status: Within Functional Limits for tasks assessed                       Extremity/Trunk Assessment               Exercises     Shoulder Instructions       General Comments      Pertinent Vitals/ Pain       Pain Assessment: 0-10 Pain Score: 2  Pain Location: difficulty breathing Pain Descriptors / Indicators: Discomfort Pain Intervention(s): Limited activity within patient's tolerance;Monitored during session;Repositioned  Home Living                                          Prior Functioning/Environment  Frequency Min 3X/week     Progress Toward Goals  OT Goals(current goals can now be found in the care plan section)  Progress towards OT goals: Not progressing toward goals - comment (Increased fatigue and SOB)  Acute Rehab OT Goals Patient Stated Goal: Get back to normal OT Goal Formulation: With patient Time For Goal Achievement: 10/14/15 Potential to Achieve Goals: Good ADL Goals Pt Will Perform Lower Body Bathing: with modified independence;with adaptive equipment;sitting/lateral leans;sit to/from stand Pt Will Perform Lower Body Dressing: with modified independence;with adaptive equipment;sitting/lateral leans;sit to/from stand Pt Will Transfer to Toilet: with modified independence;ambulating;regular height toilet Pt Will Perform Toileting - Clothing Manipulation and hygiene: with modified independence;sitting/lateral leans;sit to/from stand Pt  Will Perform Tub/Shower Transfer: Tub transfer;with supervision;ambulating;rolling walker  Plan Discharge plan needs to be updated    Co-evaluation                 End of Session     Activity Tolerance Patient limited by fatigue;Treatment limited secondary to medical complications (Comment) (dropping O2 sats)   Patient Left in chair;with call bell/phone within reach;with chair alarm set   Nurse Communication Mobility status;Other (comment) (O2 sats with activity)        Time: 1610-9604 OT Time Calculation (min): 21 min  Charges: OT General Charges $OT Visit: 1 Procedure OT Treatments $Self Care/Home Management : 8-22 mins  Nils Pyle, OTR/L 10/01/2015, 3:39 PM Pager: [phone]

## 2015-10-01 NOTE — Progress Notes (Signed)
RN called RT to patient room stating that patient needed Bipap.  Upon arrival patient was noted to be using accessory muscles, respiratory rate was in the 30s, heart rate was in the 150s, and patient stated she was feeling dizzy.  Patient placed on Bipap and is currently tolerating well.  Will continue to monitor patient.

## 2015-10-01 NOTE — Progress Notes (Addendum)
[facility] PROGRESS NOTE  [name] WUJ:[id number] DOB: 1941/09/01 DOA: 09/28/2015  PCP: Ginette Otto, MD  Brief HPI: 74 year old Caucasian female with a past medical history of diabetes, hypertension, depression, presented to the emergency department with fever, weakness, with some concern for left-sided weakness as well. She had been complaining of dysuria. She was also noted to be confused. Evaluation in the ED revealed community-acquired pneumonia and a UTI. Left-sided weakness subsided. MRI was negative for stroke.  Past medical history:  Past Medical History  Diagnosis Date  . Diabetes mellitus without complication (HCC)   . Hypertension     Consultants: None  Procedures:  Echocardiogram Study Conclusions - Left ventricle: The cavity size was normal. Systolic function wasnormal. The estimated ejection fraction was in the range of 60%to 65%. Wall motion was normal; there were no regional wallmotion abnormalities. - Mitral valve: There was mild regurgitation. - Left atrium: The atrium was mildly to moderately dilated. - Pulmonary arteries: Systolic pressure was mildly increased. PApeak pressure: 37 mm Hg (S).  Antibiotics: Ceftriaxone and azithromycin 12/24  Subjective: Patient seems to be improving slowly. States that she is chronically short of breath and has wheezes most of the time. Cough is improving.   Objective: Vital Signs  Filed Vitals:   09/30/15 2202 10/01/15 0138 10/01/15 0537 10/01/15 0857  BP: 133/58 146/68 130/65   Pulse: 106 99 90   Temp: 98.4 F (36.9 C) 98.7 F (37.1 C) 97.5 F (36.4 C)   TempSrc: Oral Oral Oral   Resp: Height:      Weight:      SpO2: 96% 99% 96% 93%    Intake/Output Summary (Last 24 hours) at 10/01/15 9562 Last data filed at 09/30/15 1803  Gross per 24 hour  Intake    240 ml  Output      0 ml  Net    240 ml   Filed Weights   09/28/15 1800  Weight: 85.1 kg (187 lb 9.8 oz)    General  appearance: alert, cooperative, appears stated age, no distress and moderately obese Resp: No change compared to yesterday. End expiratory wheezing bilaterally with few rhonchi. Crackles at the bases. Cardio: regular rate and rhythm, S1, S2 normal, no murmur, click, rub or gallop GI: soft, non-tender; bowel sounds normal; no masses,  no organomegaly Extremities: extremities normal, atraumatic, no cyanosis or edema Neurologic: She is alert and oriented 3. No facial asymmetry. Motor strength equal bilateral upper extremities. Somewhat weak in the left leg which is chronic per patient.  Lab Results:  Basic Metabolic Panel:  Recent Labs Lab 09/28/15 0930 09/28/15 1400 09/29/15 0508 09/29/15 1426 09/30/15 0555 10/01/15 0630  NA 139  --   --  138 137 141  K 4.2  --   --  3.9 4.4 4.7  CL 101  --   --  105 102 106  CO2 27  --   --  GLUCOSE 215*  --   --  308* 192* 134*  BUN 19  --   --  19 21* 23*  CREATININE 1.13*  --   --  0.95 0.95 0.90  CALCIUM 9.1  --   --  8.6* 8.5* 8.7*  MG  --  1.5* 1.8  --   --   --   PHOS  --  3.4  --   --   --   --    Liver Function Tests:  Recent Labs  Lab 09/28/15 0930 09/29/15 1426  AST 16 21  ALT 19 22  ALKPHOS 74 64  BILITOT 0.6 0.5  PROT 7.9 7.0  ALBUMIN 3.5 2.7*    Recent Labs Lab 09/28/15 0930  AMMONIA 34   CBC:  Recent Labs Lab 09/28/15 0930 09/29/15 1426 09/30/15 0555  WBC 8.5 5.9 8.0  NEUTROABS 5.1  --   --   HGB 13.1 11.8* 11.5*  HCT 43.0 39.1 37.9  MCV 83.7 82.0 81.9  PLT 187 209 215   Cardiac Enzymes:  Recent Labs Lab 09/28/15 1400  CKTOTAL 79   BNP (last 3 results)  Recent Labs  09/28/15 1400  BNP 68.5    CBG:  Recent Labs Lab 09/30/15 1605 09/30/15 2055 09/30/15 2346 10/01/15 0345 10/01/15 0805  GLUCAP 396* 376* 297* 163* 133*    Recent Results (from the past 240 hour(s))  Urine culture     Status: None (Preliminary result)   Collection Time: 09/28/15  9:58 AM  Result Value Ref  Range Status   Specimen Description URINE, CLEAN CATCH  Final   Special Requests NONE  Final   Culture   Final    >=100,000 COLONIES/mL ESCHERICHIA COLI >=100,000 COLONIES/mL GRAM POSITIVE COCCI    Report Status PENDING  Incomplete   Organism ID, Bacteria ESCHERICHIA COLI  Final      Susceptibility   Escherichia coli - MIC*    AMPICILLIN <=2 SENSITIVE Sensitive     CEFAZOLIN <=4 SENSITIVE Sensitive     CEFTRIAXONE <=1 SENSITIVE Sensitive     CIPROFLOXACIN <=0.25 SENSITIVE Sensitive     GENTAMICIN <=1 SENSITIVE Sensitive     IMIPENEM <=0.25 SENSITIVE Sensitive     NITROFURANTOIN <=16 SENSITIVE Sensitive     TRIMETH/SULFA <=20 SENSITIVE Sensitive     AMPICILLIN/SULBACTAM <=2 SENSITIVE Sensitive     PIP/TAZO <=4 SENSITIVE Sensitive     * >=100,000 COLONIES/mL ESCHERICHIA COLI  Blood culture (routine x 2)     Status: None (Preliminary result)   Collection Time: 09/28/15  1:50 PM  Result Value Ref Range Status   Specimen Description BLOOD RIGHT ANTECUBITAL  Final   Special Requests BOTTLES DRAWN AEROBIC AND ANAEROBIC  Final   Culture NO GROWTH 3 DAYS  Final   Report Status PENDING  Incomplete  Blood culture (routine x 2)     Status: None (Preliminary result)   Collection Time: 09/28/15  2:00 PM  Result Value Ref Range Status   Specimen Description BLOOD RIGHT HAND  Final   Special Requests BOTTLES DRAWN AEROBIC AND ANAEROBIC  Final   Culture NO GROWTH 3 DAYS  Final   Report Status PENDING  Incomplete  Culture, sputum-assessment     Status: None   Collection Time: 09/28/15  5:12 PM  Result Value Ref Range Status   Specimen Description SPUTUM  Final   Special Requests NONE  Final   Sputum evaluation   Final    MICROSCOPIC FINDINGS SUGGEST THAT THIS SPECIMEN IS NOT REPRESENTATIVE OF LOWER RESPIRATORY SECRETIONS. PLEASE RECOLLECT. Gram Stain Report Called to,Read Back By and Verified With: H. MCINTOSH,RN AT 1308 ON [id number] BY Lucienne Capers    Report Status 09/29/2015  FINAL  Final      Studies/Results: No results found.  Medications:  Scheduled: . arformoterol  15 mcg Nebulization BID  . aspirin  300 mg Rectal Daily   Or  . aspirin  325 mg Oral Daily  . atorvastatin  10 mg Oral Daily  . azithromycin  500  mg Oral Q24H  . budesonide (PULMICORT) nebulizer solution  0.25 mg Nebulization BID  . cefTRIAXone (ROCEPHIN)  IV  1 g Intravenous Q24H  . DULoxetine  60 mg Oral Daily  . enoxaparin (LOVENOX) injection  40 mg Subcutaneous Q24H  . fluticasone  2 spray Each Nare Daily  . gabapentin  400 mg Oral TID  . insulin aspart  0-15 Units Subcutaneous 6 times per day  . insulin aspart  5 Units Subcutaneous TID WC  . insulin glargine  50 Units Subcutaneous Daily  . ipratropium-albuterol  3 mL Nebulization Q4H  . loratadine  10 mg Oral Daily  . methylPREDNISolone (SOLU-MEDROL) injection  60 mg Intravenous 3 times per day   Continuous:  ZOX:[id number]:albuterol, ALPRAZolam  Assessment/Plan:  Principal Problem:   Left-sided weakness Active Problems:   Anxiety disorder   Peripheral neuropathy (HCC)   Asthma   Metabolic encephalopathy   Acute respiratory failure with hypoxia (HCC)   CAP (community acquired pneumonia)   Diabetes mellitus, insulin dependent (IDDM), uncontrolled (HCC)   HTN (hypertension)   Acute UTI   Left leg weakness     Acute respiratory failure with hypoxia/CAP vs asp pNA Chest x-ray revealed evidence for pneumonia. Continue antibiotics intravenously for now. Oxygen as needed. Seen by speech therapist and is on a regular diet now. Echocardiogram report reviewed. EF noted to be normal. Repeat chest x-ray today.  Metabolic encephalopathy Likely secondary to UTI and pneumonia. Mental status appears to be improved and back to baseline. Continue to monitor for now.  Urinary tract infection with Escherichia coli and GPC E coli sensitivities noted. Continue current antibiotics. Urine cultures also growing gram-positive cocci. Await final  identification and sensitivities.  Questionable Left-sided weakness Patient does have chronic left lower extremity weakness. No deficits noted in the left upper extremity. MRI was negative for stroke. MRI of the lumbar spine did show some disc protrusion, however, no concerning findings noted. PT and OT evaluation. Cleared by speech therapist. It is quite likely that her infectious process may have caused encephalopathy and weakness, which could have been misinterpreted as stroke like symptoms. No significant stenosis on carotid Doppler. Don't anticipate any further neurological workup at this time.  History of Asthma with mild exacerbation Continue nebulizer treatments, steroids. Slow to improve. Chest x-ray to be repeated today.  History of Peripheral neuropathy  Continue home medications  Anxiety disorder Continue home medications.  Diabetes mellitus, insulin dependent (IDDM), uncontrolled  Blood sugar levels improved with higher dose of Lantus. Continue to monitor. HbA1c is 7.9.  Essential hypertension   Blood pressure is reasonably well controlled. Her antihypertensives are on hold for now.  ADDENDUM Informed by RN that patient in Afib. Seen at bedside. Patient denies any chest pain. No more shortness of breath than before. Examination unchanged from this AM except for the tachycardia. EKG shows afib with rvr. CXR from this AM was unremarkable. Afib likely triggered by her respiratory effort and nebs. Will change to Xopenex. ECHo reviewed. Will transfer to stepdown for closer monitoring. Try oral cardizem first. If not successful will need cardizem infusion. Avoid BB due to reactive airways. Check ABG as sats noted to fluctuate. Will need to discuss anticoagulation when more stable.  DVT Prophylaxis: Lovenox    Code Status: DO NOT RESUSCITATE  Family Communication: Discussed with the patient. No family at bedside  Disposition Plan: Continue current treatment. PT has seen. Will need  home health at discharge.    LOS: 2 days   Kayne Yuhas  [facility] Pager [phone] 10/01/2015, 9:23 AM  If 7PM-7AM, please contact night-coverage at [email or web address], password [facility]

## 2015-10-02 DIAGNOSIS — I4891 Unspecified atrial fibrillation: Secondary | ICD-10-CM | POA: Diagnosis not present

## 2015-10-02 LAB — GLUCOSE, CAPILLARY
GLUCOSE-CAPILLARY: 317 mg/dL — AB (ref 65–99)
GLUCOSE-CAPILLARY: 302 mg/dL — AB (ref 65–99)
GLUCOSE-CAPILLARY: 360 mg/dL — AB (ref 65–99)
Glucose-Capillary: 231 mg/dL — ABNORMAL HIGH (ref 65–99)
Glucose-Capillary: 241 mg/dL — ABNORMAL HIGH (ref 65–99)
Glucose-Capillary: 364 mg/dL — ABNORMAL HIGH (ref 65–99)

## 2015-10-02 LAB — CBC
HEMATOCRIT: 38.5 % (ref 36.0–46.0)
HEMOGLOBIN: 11.3 g/dL — AB (ref 12.0–15.0)
MCH: 24.6 pg — ABNORMAL LOW (ref 26.0–34.0)
MCHC: 29.4 g/dL — ABNORMAL LOW (ref 30.0–36.0)
MCV: 83.9 fL (ref 78.0–100.0)
Platelets: 229 10*3/uL (ref 150–400)
RBC: 4.59 MIL/uL (ref 3.87–5.11)
RDW: 15.6 % — ABNORMAL HIGH (ref 11.5–15.5)
WBC: 7.6 10*3/uL (ref 4.0–10.5)

## 2015-10-02 LAB — BASIC METABOLIC PANEL
ANION GAP: 9 (ref 5–15)
BUN: 31 mg/dL — ABNORMAL HIGH (ref 6–20)
CO2: 27 mmol/L (ref 22–32)
Calcium: 8.6 mg/dL — ABNORMAL LOW (ref 8.9–10.3)
Chloride: 103 mmol/L (ref 101–111)
Creatinine, Ser: 1.29 mg/dL — ABNORMAL HIGH (ref 0.44–1.00)
GFR calc Af Amer: 46 mL/min — ABNORMAL LOW (ref >60)
GFR, EST NON AFRICAN AMERICAN: 40 mL/min — AB (ref >60)
GLUCOSE: 257 mg/dL — AB (ref 65–99)
POTASSIUM: 4.9 mmol/L (ref 3.5–5.1)
Sodium: 139 mmol/L (ref 135–145)

## 2015-10-02 LAB — PROCALCITONIN: Procalcitonin: 0.29 ng/mL

## 2015-10-02 MED ORDER — SODIUM CHLORIDE 0.45 % IV SOLN
INTRAVENOUS | Status: DC
  Administered 2015-10-02: 09:00:00 via INTRAVENOUS

## 2015-10-02 MED ORDER — INSULIN GLARGINE 100 UNIT/ML ~~LOC~~ SOLN
55.0000 [IU] | Freq: Every day | SUBCUTANEOUS | Status: DC
  Administered 2015-10-03 – 2015-10-05 (×3): 55 [IU] via SUBCUTANEOUS
  Filled 2015-10-02 (×3): qty 0.55

## 2015-10-02 MED ORDER — FUROSEMIDE 10 MG/ML IJ SOLN
20.0000 mg | Freq: Once | INTRAMUSCULAR | Status: AC
  Administered 2015-10-02: 20 mg via INTRAVENOUS
  Filled 2015-10-02: qty 2

## 2015-10-02 MED ORDER — IPRATROPIUM BROMIDE 0.02 % IN SOLN: 0.5000 mg | Freq: Four times a day (QID) | RESPIRATORY_TRACT | Status: DC

## 2015-10-02 MED ORDER — LEVALBUTEROL HCL 0.63 MG/3ML IN NEBU
0.6300 mg | INHALATION_SOLUTION | Freq: Four times a day (QID) | RESPIRATORY_TRACT | Status: DC
Start: 2015-10-02 — End: 2015-10-02

## 2015-10-02 NOTE — Care Management Important Message (Signed)
Important Message  Patient Details  Name: [name] MRN: [id number] Date of Birth: 11/27/1940   Medicare Important Message Given:  Yes    [name], [name] [name] 10/02/2015, 2:18 PM

## 2015-10-02 NOTE — Progress Notes (Addendum)
[facility] PROGRESS NOTE  [name] Allison:[id number] DOB: May 19, 1941 DOA: 09/28/2015  PCP: Ginette Otto, MD  Brief HPI: 74 year old Caucasian female with a past medical history of diabetes, hypertension, depression, presented to the emergency department with fever, weakness, with some concern for left-sided weakness as well. She had been complaining of dysuria. She was also noted to be confused. Evaluation in the ED revealed community-acquired pneumonia and a UTI. Left-sided weakness subsided. MRI was negative for stroke. On 12/27. Patient went into atrial fibrillation with RVR. She felt more dyspneic. She was transferred to the stepdown unit  Past medical history:  Past Medical History  Diagnosis Date  . Diabetes mellitus without complication (HCC)   . Hypertension     Consultants: None  Procedures:  Echocardiogram Study Conclusions - Left ventricle: The cavity size was normal. Systolic function wasnormal. The estimated ejection fraction was in the range of 60%to 65%. Wall motion was normal; there were no regional wallmotion abnormalities. - Mitral valve: There was mild regurgitation. - Left atrium: The atrium was mildly to moderately dilated. - Pulmonary arteries: Systolic pressure was mildly increased. PApeak pressure: 37 mm Hg (S).  Antibiotics: Ceftriaxone and azithromycin 12/24  Subjective: Patient feels better this morning. Not as short of breath as yesterday evening. Denies any chest pain.   Objective: Vital Signs  Filed Vitals:   10/02/15 0320 10/02/15 0400 10/02/15 0410 10/02/15 0700  BP:  110/61    Pulse: 73 73  79  Temp:   97.9 F (36.6 C)   TempSrc:   Axillary   Resp:  22  19  Height:      Weight:      SpO2: 99% 99%  99%    Intake/Output Summary (Last 24 hours) at 10/02/15 0758 Last data filed at 10/02/15 0400  Gross per 24 hour  Intake 741.74 ml  Output      0 ml  Net 741.74 ml   Filed Weights   09/28/15 1800  Weight: 85.1 kg  (187 lb 9.8 oz)    General appearance: alert, cooperative, appears stated age, no distress and moderately obese Resp: Air entry appears to be improved this morning. Less wheezing compared to yesterday.  Cardio: S1, S2 is irregularly irregular. Rate appears to be well controlled. GI: soft, non-tender; bowel sounds normal; no masses,  no organomegaly Extremities: extremities normal, atraumatic, no cyanosis or edema Neurologic: She is alert and oriented 3. No facial asymmetry. Motor strength equal bilateral upper extremities. Somewhat weak in the left leg which is chronic per patient.  Lab Results:  Basic Metabolic Panel:  Recent Labs Lab 09/28/15 0930 09/28/15 1400 09/29/15 0508 09/29/15 1426 09/30/15 0555 10/01/15 0630 10/02/15 0515  NA 139  --   --  138 137 141 139  K 4.2  --   --  3.9 4.4 4.7 4.9  CL 101  --   --  105 102 106 103  CO2 27  --   --  GLUCOSE 215*  --   --  308* 192* 134* 257*  BUN 19  --   --  19 21* 23* 31*  CREATININE 1.13*  --   --  0.95 0.95 0.90 1.29*  CALCIUM 9.1  --   --  8.6* 8.5* 8.7* 8.6*  MG  --  1.5* 1.8  --   --   --   --   PHOS  --  3.4  --   --   --   --   --  Liver Function Tests:  Recent Labs Lab 09/28/15 0930 09/29/15 1426  AST 16 21  ALT 19 22  ALKPHOS 74 64  BILITOT 0.6 0.5  PROT 7.9 7.0  ALBUMIN 3.5 2.7*    Recent Labs Lab 09/28/15 0930  AMMONIA 34   CBC:  Recent Labs Lab 09/28/15 0930 09/29/15 1426 09/30/15 0555 10/02/15 0515  WBC 8.5 5.9 8.0 7.6  NEUTROABS 5.1  --   --   --   HGB 13.1 11.8* 11.5* 11.3*  HCT 43.0 39.1 37.9 38.5  MCV 83.7 82.0 81.9 83.9  PLT 187 209 215 229   Cardiac Enzymes:  Recent Labs Lab 09/28/15 1400  CKTOTAL 79   BNP (last 3 results)  Recent Labs  09/28/15 1400  BNP 68.5    CBG:  Recent Labs Lab 10/01/15 1128 10/01/15 1649 10/01/15 1931 10/01/15 2320 10/02/15 0415  GLUCAP 263* 177* 150* 201* 231*    Recent Results (from the past 240 hour(s))    Urine culture     Status: None   Collection Time: 09/28/15  9:58 AM  Result Value Ref Range Status   Specimen Description URINE, CLEAN CATCH  Final   Special Requests NONE  Final   Culture   Final    >=100,000 COLONIES/mL ESCHERICHIA COLI >=100,000 COLONIES/mL AEROCOCCUS URINAE    Report Status 10/01/2015 FINAL  Final   Organism ID, Bacteria ESCHERICHIA COLI  Final      Susceptibility   Escherichia coli - MIC*    AMPICILLIN <=2 SENSITIVE Sensitive     CEFAZOLIN <=4 SENSITIVE Sensitive     CEFTRIAXONE <=1 SENSITIVE Sensitive     CIPROFLOXACIN <=0.25 SENSITIVE Sensitive     GENTAMICIN <=1 SENSITIVE Sensitive     IMIPENEM <=0.25 SENSITIVE Sensitive     NITROFURANTOIN <=16 SENSITIVE Sensitive     TRIMETH/SULFA <=20 SENSITIVE Sensitive     AMPICILLIN/SULBACTAM <=2 SENSITIVE Sensitive     PIP/TAZO <=4 SENSITIVE Sensitive     * >=100,000 COLONIES/mL ESCHERICHIA COLI  Blood culture (routine x 2)     Status: None (Preliminary result)   Collection Time: 09/28/15  1:50 PM  Result Value Ref Range Status   Specimen Description BLOOD RIGHT ANTECUBITAL  Final   Special Requests BOTTLES DRAWN AEROBIC AND ANAEROBIC  Final   Culture NO GROWTH 3 DAYS  Final   Report Status PENDING  Incomplete  Blood culture (routine x 2)     Status: None (Preliminary result)   Collection Time: 09/28/15  2:00 PM  Result Value Ref Range Status   Specimen Description BLOOD RIGHT HAND  Final   Special Requests BOTTLES DRAWN AEROBIC AND ANAEROBIC  Final   Culture NO GROWTH 3 DAYS  Final   Report Status PENDING  Incomplete  Culture, sputum-assessment     Status: None   Collection Time: 09/28/15  5:12 PM  Result Value Ref Range Status   Specimen Description SPUTUM  Final   Special Requests NONE  Final   Sputum evaluation   Final    MICROSCOPIC FINDINGS SUGGEST THAT THIS SPECIMEN IS NOT REPRESENTATIVE OF LOWER RESPIRATORY SECRETIONS. PLEASE RECOLLECT. Gram Stain Report Called to,Read Back By and  Verified With: H. MCINTOSH,RN AT 1308 ON [id number] BY Lucienne Capers    Report Status 09/29/2015 FINAL  Final  Respiratory virus panel     Status: None   Collection Time: 09/28/15  5:43 PM  Result Value Ref Range Status   Source - RVPAN NASAL WASHINGS  Corrected   Respiratory Syncytial  Virus A Negative Negative Final   Respiratory Syncytial Virus B Negative Negative Final   Influenza A Negative Negative Final   Influenza B Negative Negative Final   Parainfluenza 1 Negative Negative Final   Parainfluenza 2 Negative Negative Final   Parainfluenza 3 Negative Negative Final   Metapneumovirus Negative Negative Final   Rhinovirus Negative Negative Final   Adenovirus Negative Negative Final    Comment: (NOTE) Performed At: [facility] [address] [place], [place] [id number] Mila Homer MD WU:[id number]   MRSA PCR Screening     Status: None   Collection Time: 10/01/15  6:16 PM  Result Value Ref Range Status   MRSA by PCR NEGATIVE NEGATIVE Final    Comment:        The GeneXpert MRSA Assay (FDA approved for NASAL specimens only), is one component of a comprehensive MRSA colonization surveillance program. It is not intended to diagnose MRSA infection nor to guide or monitor treatment for MRSA infections.       Studies/Results: Dg Chest Port 1 View  10/01/2015  CLINICAL DATA:  Dyspnea EXAM: PORTABLE CHEST 1 VIEW COMPARISON:  09/28/2015 FINDINGS: Chronic interstitial markings. No focal consolidation. No pleural effusion or pneumothorax. The heart is normal in size. IMPRESSION: No evidence of acute cardiopulmonary disease. Electronically Signed   By: Charline Bills M.D.   On: 10/01/2015 09:52    Medications:  Scheduled: . arformoterol  15 mcg Nebulization BID  . aspirin  325 mg Oral Daily  . atorvastatin  10 mg Oral Daily  . azithromycin  500 mg Oral Q24H  . budesonide (PULMICORT) nebulizer solution  0.25 mg Nebulization BID  . cefTRIAXone (ROCEPHIN)  IV  1 g  Intravenous Q24H  . DULoxetine  60 mg Oral Daily  . enoxaparin (LOVENOX) injection  40 mg Subcutaneous Q24H  . fluticasone  2 spray Each Nare Daily  . gabapentin  400 mg Oral TID  . insulin aspart  0-15 Units Subcutaneous 6 times per day  . insulin aspart  5 Units Subcutaneous TID WC  . insulin glargine  50 Units Subcutaneous Daily  . ipratropium  0.5 mg Nebulization Q4H  . levalbuterol  0.63 mg Nebulization Q4H  . loratadine  10 mg Oral Daily  . methylPREDNISolone (SOLU-MEDROL) injection  60 mg Intravenous 3 times per day   Continuous: . sodium chloride    . diltiazem (CARDIZEM) infusion 5 mg/hr (10/01/15 1819)   NFA:[id number], diltiazem, levalbuterol  Assessment/Plan:  Principal Problem:   Left-sided weakness Active Problems:   Anxiety disorder   Peripheral neuropathy (HCC)   Asthma   Metabolic encephalopathy   Acute respiratory failure with hypoxia (HCC)   CAP (community acquired pneumonia)   Diabetes mellitus, insulin dependent (IDDM), uncontrolled (HCC)   HTN (hypertension)   Acute UTI   Left leg weakness    Atrial Fibrillation with RVR Patient's heart rate has improved on a Cardizem infusion. Continue for now till her respiratory status improves. At that point in time, she could be transitioned to oral diltiazem. CHADS2VASC score is 4. She will benefit from anticoagulation. This will need to be discussed with her once her respiratory status is more stable. Continue aspirin for now.  Acute respiratory failure with hypoxia/CAP vs asp pNA Chest x-ray r. At the time of admission evealed evidence for pneumonia. Continue antibiotics intravenously for now. Oxygen as needed. Seen by speech therapist and is on a regular diet now. Echocardiogram report reviewed. EF noted to be normal. Slightly elevated PA pressures noted.  Repeat chest x-ray did not show anything concerning. Patient became more dyspneic overnight. She required BiPAP. Improved this morning. Use BiPAP as  needed.  Asthma with exacerbation Continue nebulizer treatments, steroids. Slow to improve.   Metabolic encephalopathy Likely secondary to UTI and pneumonia. Mental status appears to be improved and back to baseline. Continue to monitor for now.  Urinary tract infection with Escherichia coli and Aerococcus E coli sensitivities noted. Continue current antibiotics. Urine cultures also growing Aerococcus.   Questionable Left-sided weakness Patient does have chronic left lower extremity weakness. No deficits noted in the left upper extremity. MRI was negative for stroke. MRI of the lumbar spine did show some disc protrusion, however, no concerning findings noted. PT and OT evaluation. Cleared by speech therapist. It is quite likely that her infectious process may have caused encephalopathy and weakness, which could have been misinterpreted as stroke like symptoms. No significant stenosis on carotid Doppler. Don't anticipate any further neurological workup at this time.  Mildly elevated BUN and creatinine Likely due to poor oral intake. Gentle hydration. Recheck tomorrow morning  History of Peripheral neuropathy  Continue home medications  Anxiety disorder Continue home medications.  Diabetes mellitus, insulin dependent (IDDM), uncontrolled  Blood sugar levels had initially improved with higher dose of Lantus. Noted to be elevated this morning. Will increase dose further. Continue to monitor. HbA1c is 7.9.  Essential hypertension   Blood pressure is reasonably well controlled. Her antihypertensives are on hold for now.   DVT Prophylaxis: Lovenox    Code Status: DO NOT RESUSCITATE  Family Communication: Discussed with the patient. No family at bedside  Disposition Plan: Continue current treatment. PT has seen. Will need home health at discharge.    LOS: 3 days   [facility] SuiteKRISHNAN,Elston Aldape  [facility] Pager [phone] 10/02/2015, 7:58 AM  If 7PM-7AM, please contact night-coverage at  [email or web address], password [email or web address]

## 2015-10-02 NOTE — Progress Notes (Addendum)
UR COMPLETED  

## 2015-10-02 NOTE — Progress Notes (Signed)
Physical Therapy Treatment Patient Details Name: [name] MRN: [id number] DOB: 06/02/1941 Today's Date: 10/02/2015    History of Present Illness Pt is a 74 y/o female with a history of diabetes, HTN, depression, anxiety who presented to the ED with left-sided weakness, left facial weakness (improved since seen in ED per notes), subjective fevers, generalized weakness, dysuria and altered mental status. Head CT which was done was unremarkable. Per MD notes, likely metabolic encephalopathy secondary to community acquired pneumonia and urinary tract infection.    PT Comments    Pt progressing w/ ambulatory endurance but remains limited by low SpO2 (down to 86% on 2 LPM), and Bil LE fatigue.    Follow Up Recommendations  Home health PT;Supervision for mobility/OOB     Equipment Recommendations  None recommended by PT    Recommendations for Other Services       Precautions / Restrictions Precautions Precautions: Fall Restrictions Weight Bearing Restrictions: No    Mobility  Bed Mobility Overal bed mobility: Needs Assistance Bed Mobility: Sit to Supine     Supine to sit: Supervision     General bed mobility comments: Cues for technique to [facility]  Transfers Overall transfer level: Needs assistance Equipment used: Rolling walker (2 wheeled) Transfers: Sit to/from Stand Sit to Stand: Min guard         General transfer comment: Cues for hand placement as pt initially attempts to pull on RW.    Ambulation/Gait Ambulation/Gait assistance: Min guard Ambulation Distance (Feet): 50 Feet Assistive device: Rolling walker (2 wheeled) Gait Pattern/deviations: Step-through pattern;Antalgic;Decreased stride length   Gait velocity interpretation: Below normal speed for age/gender General Gait Details: Cues for proper management of RW as she has tendency to push it too far ahead.  Standing rest break x4 due to drop in SpO2.  Lowest to 86% on 2 LPM.  Able to return back to  90's w/ verbal cues for pursed lip breathing.   Stairs            Wheelchair Mobility    Modified Rankin (Stroke Patients Only)       Balance Overall balance assessment: Needs assistance Sitting-balance support: Feet supported Sitting balance-Leahy Scale: Fair     Standing balance support: Bilateral upper extremity supported;During functional activity Standing balance-Leahy Scale: Poor Standing balance comment: RW for support                    Cognition Arousal/Alertness: Awake/alert Behavior During Therapy: WFL for tasks assessed/performed Overall Cognitive Status: Within Functional Limits for tasks assessed                      Exercises General Exercises - Lower Extremity Ankle Circles/Pumps: AROM;Both;10 reps;Seated Straight Leg Raises: AROM;Both;5 reps;Seated    General Comments        Pertinent Vitals/Pain Pain Assessment: No/denies pain Pain Intervention(s): Limited activity within patient's tolerance;Monitored during session    Home Living                      Prior Function            PT Goals (current goals can now be found in the care plan section) Acute Rehab PT Goals Patient Stated Goal: go home  PT Goal Formulation: With patient Time For Goal Achievement: 10/07/15 Potential to Achieve Goals: Good Progress towards PT goals: Progressing toward goals    Frequency  Min 3X/week    PT Plan Current plan remains  appropriate    Co-evaluation             End of Session Equipment Utilized During Treatment: Gait belt;Oxygen Activity Tolerance: Patient limited by fatigue Patient left: with call bell/phone within reach;in bed;with bed alarm set     Time: 1610-9604 PT Time Calculation (min) (ACUTE ONLY): 28 min  Charges:  $Gait Training: 8-22 mins $Therapeutic Exercise: 8-22 mins                    G Codes:      Michail Jewels PT, [place] [phone] Pager: [phone] 10/02/2015, 2:35 PM

## 2015-10-02 NOTE — Progress Notes (Signed)
Patient on 2 LPM, no distress noted. RR 16-20. Resting comfortably. BBS clear and diminished in the bases. Will continue to monitor

## 2015-10-02 NOTE — Progress Notes (Signed)
Patient unable to void. Bladder scan at 1140 showed 139cc. Patient voided 20cc at that time. Patient had not voided since this morning, placed on BSC and patient spontaneously voided 250. Bladder scan after void showed 118cc. Lung assessment revealed coarse crackles on her left lower lobe. Dr. Barnie DelG. Krishnan called and made aware.

## 2015-10-03 LAB — CULTURE, BLOOD (ROUTINE X 2)
Culture: NO GROWTH
Culture: NO GROWTH

## 2015-10-03 LAB — CBC
HEMATOCRIT: 37.4 % (ref 36.0–46.0)
HEMOGLOBIN: 11.1 g/dL — AB (ref 12.0–15.0)
MCH: 24.7 pg — AB (ref 26.0–34.0)
MCHC: 29.7 g/dL — AB (ref 30.0–36.0)
MCV: 83.1 fL (ref 78.0–100.0)
Platelets: 230 10*3/uL (ref 150–400)
RBC: 4.5 MIL/uL (ref 3.87–5.11)
RDW: 15.3 % (ref 11.5–15.5)
WBC: 7.2 10*3/uL (ref 4.0–10.5)

## 2015-10-03 LAB — GLUCOSE, CAPILLARY
GLUCOSE-CAPILLARY: 214 mg/dL — AB (ref 65–99)
GLUCOSE-CAPILLARY: 244 mg/dL — AB (ref 65–99)
GLUCOSE-CAPILLARY: 323 mg/dL — AB (ref 65–99)
GLUCOSE-CAPILLARY: 270 mg/dL — AB (ref 65–99)
Glucose-Capillary: 190 mg/dL — ABNORMAL HIGH (ref 65–99)
Glucose-Capillary: 90 mg/dL (ref 65–99)

## 2015-10-03 LAB — BASIC METABOLIC PANEL
ANION GAP: 8 (ref 5–15)
BUN: 40 mg/dL — ABNORMAL HIGH (ref 6–20)
CALCIUM: 8.7 mg/dL — AB (ref 8.9–10.3)
CO2: 27 mmol/L (ref 22–32)
Chloride: 100 mmol/L — ABNORMAL LOW (ref 101–111)
Creatinine, Ser: 1.14 mg/dL — ABNORMAL HIGH (ref 0.44–1.00)
GFR, EST AFRICAN AMERICAN: 54 mL/min — AB (ref >60)
GFR, EST NON AFRICAN AMERICAN: 47 mL/min — AB (ref >60)
GLUCOSE: 243 mg/dL — AB (ref 65–99)
POTASSIUM: 4.6 mmol/L (ref 3.5–5.1)
SODIUM: 135 mmol/L (ref 135–145)

## 2015-10-03 MED ORDER — DILTIAZEM HCL ER COATED BEADS 120 MG PO CP24
120.0000 mg | ORAL_CAPSULE | Freq: Every day | ORAL | Status: DC
  Administered 2015-10-03 – 2015-10-05 (×3): 120 mg via ORAL
  Filled 2015-10-03 (×3): qty 1

## 2015-10-03 MED ORDER — CETYLPYRIDINIUM CHLORIDE 0.05 % MT LIQD
7.0000 mL | Freq: Two times a day (BID) | OROMUCOSAL | Status: DC
  Administered 2015-10-03 – 2015-10-05 (×5): 7 mL via OROMUCOSAL

## 2015-10-03 NOTE — Progress Notes (Signed)
Foley placed at bedside at 0244 with assistant with x1 attempt. Please see flow sheet charting for addition data. Patient experienced minimum discomfort and tolerated procedure.

## 2015-10-03 NOTE — Progress Notes (Signed)
Pt resting well after transfer from 3S. Pt family at bedside.

## 2015-10-03 NOTE — Care Management Note (Addendum)
Case Management Note Initial note started by Gae GallopAngela Cole, RN, Case Manager  Patient Details  Name: Abigail LorMary N Gholson MRN: [id number] Date of Birth: 07/28/1941   Subjective/Objective: 74 year old Caucasian female with a past medical history of diabetes, hypertension, depression, presented to the emergency department with fever, weakness, with some concern for left-sided weakness as well. She had been complaining of dysuria. She was also noted to be confused. Evaluation in the ED revealed community-acquired pneumonia and a UTI. On 12/27 patient went into atrial fibrillation with RVR. Pt is from home with husband. Independent with ADL's prior to admit. Daughter states pt has wheelchair ,walker and 3 in 1 @ home.  Action/Plan: Return to home when medically stable.CM to f/u with disposition needs.  Expected Discharge Date:                  Expected Discharge Plan:  Home w Home Health Services  In-House Referral:     Discharge planning Services  CM Consult  Post Acute Care Choice:    Choice offered to:  Patient  DME Arranged:  N/A DME Agency:  NA  HH Arranged:  PT, RN, OT HH Agency:  [facility] Status of Service:  Completed, signed off  Medicare Important Message Given:  Yes Date Medicare IM Given:    Medicare IM give by:    Date Additional Medicare IM Given:    Additional Medicare Important Message give by:     If discussed at Long Length of Stay Meetings, dates discussed:    Additional Comments: [address] Tomi BambergerBrenda Graves-Bigelow, RN,BSN [phone] [facility]:  Xarelto: $40 for each rx, for a 30, 60, or 90 day supply at retail, no auth required, patient can use any retail pharmacy    1202 10-04-15 Tomi BambergerBrenda Graves-Bigelow, [place]BSN [phone] CM [facility] and faxed information was received. CM to fax orders today to [phone]. CM did relay that pt will be d/c on 10-05-15 and SOC will begin 10-08-15. Xarelto 30 day free card to be given to pt. [facility]  [facility] [place] and medication is available. CM did speak with daughter Arline AspCindy and she will work with new Rx coverage for an additional 90 day supply. No further needs from CM at this time.    1606 10-03-15 Tomi BambergerBrenda Graves-Bigelow, RN,BSN [phone] CM received call from Daughter Arline AspCindy at [phone] and she wants her mother to use [facility]. Per daughter patient's insurance will [facility] to [facility] in the new year. CM did cancel services with [facility] and made referral with [facility]. Referral faxed to Vickie at the Central Intake Office at [phone]. CM [facility] to [facility] as well. No further needs from CM at this time.    1512 10-03-15 Tomi BambergerBrenda Graves-Bigelow, RN,BSN [phone] CM did speak with previous Case Manager and [facility] service patient due to out of zip code range. [facility]. [facility] within 24-48 hrs of d/c. No further needs from CM at this time.   Gala LewandowskyGraves-Bigelow, Keith Cancio Kaye, RN 10/03/2015, 3:11 PM

## 2015-10-03 NOTE — Care Management Note (Signed)
Case Management Note  Patient Details  Name: [name] MRN: [id number] Date of Birth: 06/06/1941  Subjective/Objective:       74 year old Caucasian female with a past medical history of diabetes, hypertension, depression, presented to the emergency department with fever, weakness, with some concern for left-sided weakness as well. She had been complaining of dysuria. She was also noted to be confused. Evaluation in the ED revealed community-acquired pneumonia and a UTI. On 12/27 patient went into atrial fibrillation with RVR. Pt is from home with husband. Independent with ADL's prior to admit. Daughter states pt has wheelchair ,walker and 3 in 1 @ home.    Action/Plan:  Return to home when medically stable.CM to f/u with disposition needs.  Expected Discharge Date:                  Expected Discharge Plan:  Home w Home Health Services  In-House Referral:     Discharge planning Services  CM Consult  Post Acute Care Choice:    Choice offered to:  Patient  DME Arranged:    DME Agency:     HH Arranged:   HH Agency:    Status of Service:  In process, will continue to follow  Medicare Important Message Given:  Yes Date Medicare IM Given:    Medicare IM give by:    Date Additional Medicare IM Given:    Additional Medicare Important Message give by:     If discussed at Long Length of Stay Meetings, dates discussed:    Additional Comments:  Per PT's evaluation/recommendation : HHPT. CM shared with pt/family and  pt agreed to hh services. Daughter selected [facility] for mom with mom's approval. CM called and made referral with Donna([facility]) for hhpt  and learned they [place].CM to f/u with pt.  Gae GallopCole, Monnica Saltsman MecklingHudson, [place]RN,BSN,CM [phone] 10/03/2015, 2:41 PM

## 2015-10-03 NOTE — Plan of Care (Signed)
Problem: Activity: Goal: Risk for activity intolerance will decrease Outcome: [facility] Activity encouraged

## 2015-10-03 NOTE — Progress Notes (Signed)
[facility] PROGRESS NOTE  [name] ZOX:[id number] DOB: 03/11/1941 DOA: 09/28/2015  PCP: Ginette OttoSTONEKING,HAL THOMAS, MD  Brief HPI: 74 year old Caucasian female with a past medical history of diabetes, hypertension, depression, presented to the emergency department with fever, weakness, with some concern for left-sided weakness as well. She had been complaining of dysuria. She was also noted to be confused. Evaluation in the ED revealed community-acquired pneumonia and a UTI. Left-sided weakness subsided. MRI was negative for stroke. On 12/27. Patient went into atrial fibrillation with RVR. She felt more dyspneic. She was transferred to the stepdown unit  Past medical history:  Past Medical History  Diagnosis Date  . Diabetes mellitus without complication (HCC)   . Hypertension     Consultants: None  Procedures:  Echocardiogram Study Conclusions - Left ventricle: The cavity size was normal. Systolic function wasnormal. The estimated ejection fraction was in the range of 60%to 65%. Wall motion was normal; there were no regional wallmotion abnormalities. - Mitral valve: There was mild regurgitation. - Left atrium: The atrium was mildly to moderately dilated. - Pulmonary arteries: Systolic pressure was mildly increased. PApeak pressure: 37 mm Hg (S).  Antibiotics: Ceftriaxone and azithromycin 12/24  Subjective: She is awake and alert, thinks she is feeling better today. Denies fever chills.   Objective: Vital Signs  Filed Vitals:   10/03/15 0400 10/03/15 0742 10/03/15 0800 10/03/15 0806  BP: 122/59  120/64   Pulse: 71  78   Temp:  97.9 F (36.6 C)    TempSrc:  Oral    Resp: 18  20   Height:      Weight:      SpO2: 95%  94% 96%    Intake/Output Summary (Last 24 hours) at 10/03/15 [id number] Last data filed at 10/03/15 0800  Gross per 24 hour  Intake   2010 ml  Output    820 ml  Net   1190 ml   Filed Weights   09/28/15 1800 10/02/15 1817  Weight: 85.1 kg (187 lb  9.8 oz) 86.6 kg (190 lb 14.7 oz)    General appearance: alert, cooperative, appears stated age, no distress and moderately obese Resp: Air entry appears to be improved this morning. Less wheezing compared to yesterday.  Cardio: S1, S2 is irregularly irregular. Rate appears to be well controlled. GI: soft, non-tender; bowel sounds normal; no masses,  no organomegaly Extremities: extremities normal, atraumatic, no cyanosis or edema Neurologic: She is alert and oriented 3. No facial asymmetry. Motor strength equal bilateral upper extremities. Somewhat weak in the left leg which is chronic per patient.  Lab Results:  Basic Metabolic Panel:  Recent Labs Lab 09/28/15 1400 09/29/15 0508 09/29/15 1426 09/30/15 0555 10/01/15 0630 10/02/15 0515 10/03/15 0252  NA  --   --  138 137 141 139 135  K  --   --  3.9 4.4 4.7 4.9 4.6  CL  --   --  105 102 106 103 100*  CO2  --   --  26 26 29 27 27   GLUCOSE  --   --  308* 192* 134* 257* 243*  BUN  --   --  19 21* 23* 31* 40*  CREATININE  --   --  0.95 0.95 0.90 1.29* 1.14*  CALCIUM  --   --  8.6* 8.5* 8.7* 8.6* 8.7*  MG 1.5* 1.8  --   --   --   --   --   PHOS 3.4  --   --   --   --   --   --  Liver Function Tests:  Recent Labs Lab 09/28/15 0930 09/29/15 1426  AST 16 21  ALT 19 22  ALKPHOS 74 64  BILITOT 0.6 0.5  PROT 7.9 7.0  ALBUMIN 3.5 2.7*    Recent Labs Lab 09/28/15 0930  AMMONIA 34   CBC:  Recent Labs Lab 09/28/15 0930 09/29/15 1426 09/30/15 0555 10/02/15 0515 10/03/15 0252  WBC 8.5 5.9 8.0 7.6 7.2  NEUTROABS 5.1  --   --   --   --   HGB 13.1 11.8* 11.5* 11.3* 11.1*  HCT 43.0 39.1 37.9 38.5 37.4  MCV 83.7 82.0 81.9 83.9 83.1  PLT 187 209 215 229 230   Cardiac Enzymes:  Recent Labs Lab 09/28/15 1400  CKTOTAL 79   BNP (last 3 results)  Recent Labs  09/28/15 1400  BNP 68.5    CBG:  Recent Labs Lab 10/02/15 1628 10/02/15 1942 10/02/15 2313 10/03/15 0326 10/03/15 0739  GLUCAP 302* 364* 360*  214* 90    Recent Results (from the past 240 hour(s))  Urine culture     Status: None   Collection Time: 09/28/15  9:58 AM  Result Value Ref Range Status   Specimen Description URINE, CLEAN CATCH  Final   Special Requests NONE  Final   Culture   Final    >=100,000 COLONIES/mL ESCHERICHIA COLI >=100,000 COLONIES/mL AEROCOCCUS URINAE    Report Status 10/01/2015 FINAL  Final   Organism ID, Bacteria ESCHERICHIA COLI  Final      Susceptibility   Escherichia coli - MIC*    AMPICILLIN <=2 SENSITIVE Sensitive     CEFAZOLIN <=4 SENSITIVE Sensitive     CEFTRIAXONE <=1 SENSITIVE Sensitive     CIPROFLOXACIN <=0.25 SENSITIVE Sensitive     GENTAMICIN <=1 SENSITIVE Sensitive     IMIPENEM <=0.25 SENSITIVE Sensitive     NITROFURANTOIN <=16 SENSITIVE Sensitive     TRIMETH/SULFA <=20 SENSITIVE Sensitive     AMPICILLIN/SULBACTAM <=2 SENSITIVE Sensitive     PIP/TAZO <=4 SENSITIVE Sensitive     * >=100,000 COLONIES/mL ESCHERICHIA COLI  Blood culture (routine x 2)     Status: None (Preliminary result)   Collection Time: 09/28/15  1:50 PM  Result Value Ref Range Status   Specimen Description BLOOD RIGHT ANTECUBITAL  Final   Special Requests BOTTLES DRAWN AEROBIC AND ANAEROBIC  Final   Culture NO GROWTH 4 DAYS  Final   Report Status PENDING  Incomplete  Blood culture (routine x 2)     Status: None (Preliminary result)   Collection Time: 09/28/15  2:00 PM  Result Value Ref Range Status   Specimen Description BLOOD RIGHT HAND  Final   Special Requests BOTTLES DRAWN AEROBIC AND ANAEROBIC  Final   Culture NO GROWTH 4 DAYS  Final   Report Status PENDING  Incomplete  Culture, sputum-assessment     Status: None   Collection Time: 09/28/15  5:12 PM  Result Value Ref Range Status   Specimen Description SPUTUM  Final   Special Requests NONE  Final   Sputum evaluation   Final    MICROSCOPIC FINDINGS SUGGEST THAT THIS SPECIMEN IS NOT REPRESENTATIVE OF LOWER RESPIRATORY SECRETIONS. PLEASE  RECOLLECT. Gram Stain Report Called to,Read Back By and Verified With: H. MCINTOSH,RN AT 1308 ON [id number] BY Lucienne Capers    Report Status 09/29/2015 FINAL  Final  Respiratory virus panel     Status: None   Collection Time: 09/28/15  5:43 PM  Result Value Ref Range Status   Source -  RVPAN NASAL WASHINGS  Corrected   Respiratory Syncytial Virus A Negative Negative Final   Respiratory Syncytial Virus B Negative Negative Final   Influenza A Negative Negative Final   Influenza B Negative Negative Final   Parainfluenza 1 Negative Negative Final   Parainfluenza 2 Negative Negative Final   Parainfluenza 3 Negative Negative Final   Metapneumovirus Negative Negative Final   Rhinovirus Negative Negative Final   Adenovirus Negative Negative Final    Comment: (NOTE) Performed At: [facility] [address] [place], [place] [id number] Mila Homer MD WU:[id number]   MRSA PCR Screening     Status: None   Collection Time: 10/01/15  6:16 PM  Result Value Ref Range Status   MRSA by PCR NEGATIVE NEGATIVE Final    Comment:        The GeneXpert MRSA Assay (FDA approved for NASAL specimens only), is one component of a comprehensive MRSA colonization surveillance program. It is not intended to diagnose MRSA infection nor to guide or monitor treatment for MRSA infections.       Studies/Results: Dg Chest Port 1 View  10/01/2015  CLINICAL DATA:  Dyspnea EXAM: PORTABLE CHEST 1 VIEW COMPARISON:  09/28/2015 FINDINGS: Chronic interstitial markings. No focal consolidation. No pleural effusion or pneumothorax. The heart is normal in size. IMPRESSION: No evidence of acute cardiopulmonary disease. Electronically Signed   By: Charline Bills M.D.   On: 10/01/2015 09:52    Medications:  Scheduled: . arformoterol  15 mcg Nebulization BID  . aspirin  325 mg Oral Daily  . atorvastatin  10 mg Oral Daily  . azithromycin  500 mg Oral Q24H  . budesonide (PULMICORT) nebulizer solution  0.25 mg  Nebulization BID  . cefTRIAXone (ROCEPHIN)  IV  1 g Intravenous Q24H  . diltiazem  120 mg Oral Daily  . DULoxetine  60 mg Oral Daily  . enoxaparin (LOVENOX) injection  40 mg Subcutaneous Q24H  . fluticasone  2 spray Each Nare Daily  . gabapentin  400 mg Oral TID  . insulin aspart  0-15 Units Subcutaneous 6 times per day  . insulin aspart  5 Units Subcutaneous TID WC  . insulin glargine  55 Units Subcutaneous Daily  . loratadine  10 mg Oral Daily  . methylPREDNISolone (SOLU-MEDROL) injection  60 mg Intravenous 3 times per day   Continuous:   [id number], diltiazem, levalbuterol  Assessment/Plan:  Principal Problem:   Atrial fibrillation with rapid ventricular response (HCC) Active Problems:   Anxiety disorder   Peripheral neuropathy (HCC)   Asthma   Metabolic encephalopathy   Acute respiratory failure with hypoxia (HCC)   CAP (community acquired pneumonia)   Diabetes mellitus, insulin dependent (IDDM), uncontrolled (HCC)   HTN (hypertension)   Acute UTI   Left-sided weakness   Left leg weakness    Atrial Fibrillation with RVR -CHADSCasc score of 4  -Improved, currently ventricular rates in the 70's -Will discontinue IV Cardizem and start Cardizem 120 mg PO q daily -Plan to discuss anticoagulation with patient and family.   Acute respiratory failure with hypoxia/CAP vs asp pNA -Overall looks better although continues to have expiratory wheezes -Continue IV steroids for another 24 hours, reassess in am.   Asthma with exacerbation Continue nebulizer treatments, steroids. Slow to improve.   Metabolic encephalopathy Likely secondary to UTI and pneumonia. Mental status appears to be improved and back to baseline. Continue to monitor for now.  Urinary tract infection with Escherichia coli and Aerococcus E coli sensitivities noted. Continue current antibiotics.  Urine cultures also growing Aerococcus.   -Organism pan-susceptible. On IV ceftriaxone.  -Anticpate  transitioning to oral antibiotics in am.   Questionable Left-sided weakness Patient does have chronic left lower extremity weakness. No deficits noted in the left upper extremity. MRI was negative for stroke. MRI of the lumbar spine did show some disc protrusion, however, no concerning findings noted. PT and OT evaluation. Cleared by speech therapist. It is quite likely that her infectious process may have caused encephalopathy and weakness, which could have been misinterpreted as stroke like symptoms. No significant stenosis on carotid Doppler. Don't anticipate any further neurological workup at this time.  Mildly elevated BUN and creatinine Likely due to poor oral intake. Gentle hydration. Recheck tomorrow morning  History of Peripheral neuropathy  Continue home medications  Anxiety disorder Continue home medications.  Diabetes mellitus, insulin dependent (IDDM), uncontrolled  Blood sugar levels had initially improved with higher dose of Lantus. Noted to be elevated this morning. Will increase dose further. Continue to monitor. HbA1c is 7.9.  Essential hypertension   Blood pressure is reasonably well controlled.  -Will continue Cardizem 120 mg PO q daily   DVT Prophylaxis: Lovenox    Code Status: DO NOT RESUSCITATE  Family Communication: I spoke with daughter over telephone.  Disposition Plan: Will trasfer to medsurg    LOS: 4 days   [name]  [facility] Pager [phone] 10/03/2015, 8:24 AM  If 7PM-7AM, please contact night-coverage at [email or web address], password [facility]

## 2015-10-03 NOTE — Progress Notes (Signed)
Patient is resting comfortably on 2L [place] with O2 sat 95%. BIPAP not needed at this time. RT will continue to monitor.

## 2015-10-04 LAB — CBC
HEMATOCRIT: 39.2 % (ref 36.0–46.0)
HEMOGLOBIN: 11.7 g/dL — AB (ref 12.0–15.0)
MCH: 24.6 pg — ABNORMAL LOW (ref 26.0–34.0)
MCHC: 29.8 g/dL — AB (ref 30.0–36.0)
MCV: 82.4 fL (ref 78.0–100.0)
Platelets: 282 10*3/uL (ref 150–400)
RBC: 4.76 MIL/uL (ref 3.87–5.11)
RDW: 15 % (ref 11.5–15.5)
WBC: 8.6 10*3/uL (ref 4.0–10.5)

## 2015-10-04 LAB — GLUCOSE, CAPILLARY
Glucose-Capillary: 97 mg/dL (ref 65–99)
Glucose-Capillary: 120 mg/dL — ABNORMAL HIGH (ref 65–99)
Glucose-Capillary: 207 mg/dL — ABNORMAL HIGH (ref 65–99)
Glucose-Capillary: 258 mg/dL — ABNORMAL HIGH (ref 65–99)
Glucose-Capillary: 340 mg/dL — ABNORMAL HIGH (ref 65–99)
Glucose-Capillary: 243 mg/dL — ABNORMAL HIGH (ref 65–99)

## 2015-10-04 LAB — BASIC METABOLIC PANEL
ANION GAP: 7 (ref 5–15)
BUN: 36 mg/dL — ABNORMAL HIGH (ref 6–20)
CALCIUM: 8.9 mg/dL (ref 8.9–10.3)
CHLORIDE: 102 mmol/L (ref 101–111)
CO2: 29 mmol/L (ref 22–32)
Creatinine, Ser: 1.02 mg/dL — ABNORMAL HIGH (ref 0.44–1.00)
GFR calc Af Amer: >60 mL/min (ref >60)
GFR calc non Af Amer: 53 mL/min — ABNORMAL LOW (ref >60)
GLUCOSE: 155 mg/dL — AB (ref 65–99)
Potassium: 4.7 mmol/L (ref 3.5–5.1)
Sodium: 138 mmol/L (ref 135–145)

## 2015-10-04 MED ORDER — PREDNISONE 20 MG PO TABS
60.0000 mg | ORAL_TABLET | Freq: Every day | ORAL | Status: DC
Start: 2015-10-05 — End: 2015-10-05
  Administered 2015-10-05: 60 mg via ORAL
  Filled 2015-10-04: qty 3

## 2015-10-04 MED ORDER — RIVAROXABAN 15 MG PO TABS
15.0000 mg | ORAL_TABLET | Freq: Every day | ORAL | Status: DC
  Administered 2015-10-04: 15 mg via ORAL
  Filled 2015-10-04: qty 1

## 2015-10-04 NOTE — Progress Notes (Addendum)
ANTICOAGULATION CONSULT NOTE - Initial Consult  Pharmacy Consult for Xarelto Indication: atrial fibrillation  Allergies  Allergen Reactions  . Etodolac Anaphylaxis    Patient Measurements: Height: 5' (152.4 cm) Weight: 188 lb 3.2 oz (85.367 kg) IBW/kg (Calculated) : 45.5  Vital Signs: Temp: 97.7 F (36.5 C) (12/30 0524) Temp Source: Oral (12/30 0524) BP: 131/64 mmHg (12/30 0524) Pulse Rate: 86 (12/30 0524)  Labs:  Recent Labs  10/02/15 0515 10/03/15 0252 10/04/15 0350  HGB 11.3* 11.1* 11.7*  HCT 38.5 37.4 39.2  PLT 229 230 282  CREATININE 1.29* 1.14* 1.02*    Estimated Creatinine Clearance: 47.7 mL/min (by C-G formula based on Cr of 1.02).   Medical History: Past Medical History  Diagnosis Date  . Diabetes mellitus without complication (HCC)   . Hypertension     Medications:  Prescriptions prior to admission  Medication Sig Dispense Refill Last Dose  . ALPRAZolam (XANAX) 0.25 MG tablet Take 0.25 mg by mouth 2 (two) times daily as needed for anxiety or sleep.   Past Week at Unknown time  . aspirin EC 81 MG tablet Take 81 mg by mouth daily.   09/27/2015 at Unknown time  . atorvastatin (LIPITOR) 10 MG tablet Take 10 mg by mouth daily.   09/27/2015 at Unknown time  . cetirizine (ZYRTEC) 10 MG tablet Take 10 mg by mouth daily as needed for allergies.   09/27/2015 at Unknown time  . DULoxetine (CYMBALTA) 60 MG capsule Take 60 mg by mouth daily.  5 09/27/2015 at Unknown time  . gabapentin (NEURONTIN) 400 MG capsule Take 400 mg by mouth 3 (three) times daily as needed.  2 09/27/2015 at Unknown time  . HYDROcodone-acetaminophen (NORCO/VICODIN) 5-325 MG tablet Take 1 tablet by mouth every 6 (six) hours as needed for moderate pain.   Past Month at Unknown time  . Insulin Glargine (TOUJEO SOLOSTAR [place]) Inject 40 Units into the skin daily.   09/27/2015 at Unknown time  . losartan-hydrochlorothiazide (HYZAAR) 100-25 MG tablet Take 1 tablet by mouth daily.  7 09/27/2015 at  Unknown time  . metFORMIN (GLUCOPHAGE) 500 MG tablet Take 1,000 mg by mouth 2 (two) times daily.  0 09/27/2015 at Unknown time  . PROAIR HFA 108 (90 BASE) MCG/ACT inhaler Inhale 2 puffs into the lungs every 4 (four) hours as needed for wheezing.   1 09/27/2015 at Unknown time    Assessment: 74 y/o F presented to ED with CAP and UTI. MRI negative for CVA. Patient went into afib on 12/27. EF 60-65%  Anticoagulation: new afib. CHADSCasc score of 4. Order to start Xarelto for afib. Hgb only 11.7 but stable for a few days. Scr 1.02 down with estimated CrCl 47.  Goal of Therapy:  Therapeutic oral anticoagulation Monitor platelets by anticoagulation protocol: Yes   Plan:  D/c Lovenox 40mg /d Start Xarelto 15mg  daily for CrCl<50. Lower ASA dose?   Vanita Cannell S. Merilynn Finlandobertson, PharmD, BCPS Clinical Staff Pharmacist Pager [phone]  Misty Stanleyobertson, Bryce Kimble Stillinger 10/04/2015,11:35 AM

## 2015-10-04 NOTE — Progress Notes (Addendum)
Inpatient Diabetes Program Recommendations  AACE/ADA: New Consensus Statement on Inpatient Glycemic Control (2015)  Target Ranges:  Prepandial:   less than 140 mg/dL      Peak postprandial:   less than 180 mg/dL (1-2 hours)      Critically ill patients:  140 - 180 mg/dL   Results for Marletta LorOE, Tyesha N (MRN [id number]) as of 10/04/2015 07:39  Ref. Range 10/03/2015 07:39 10/03/2015 12:49 10/03/2015 16:58 10/03/2015 20:41 10/03/2015 23:55 10/04/2015 04:24  Glucose-Capillary Latest Ref Range: 65-99 mg/dL 90 130244 (H) 865323 (H) 784270 (H) 190 (H) 97   Review of Glycemic Control  Diabetes history: DM 2 Outpatient Diabetes medications: Toujeo 40 units, Metformin 1,000 mg BID Current orders for Inpatient glycemic control: Lantus 55 units, Novolog Moderate + Novolog 5 units TID meal coverage  Inpatient Diabetes Program Recommendations: Insulin - Meal Coverage: Glucose increased into the 300's at meal times. Please consider increasing meal coverage to at least Novolog 8 units TID in addition to correction scale. Noted pt still on IV solumedrol.  Thanks,  Christena DeemShannon Damonique Brunelle RN, MSN, [facility] Inpatient Diabetes Coordinator Team Pager [phone] (8a-5p)

## 2015-10-04 NOTE — Discharge Instructions (Signed)

## 2015-10-04 NOTE — Progress Notes (Signed)
Physical Therapy Treatment Patient Details Name: [name] MRN: [id number] DOB: [birth year] Today's Date: 10/04/2015    History of Present Illness Pt is a 74 y/o female with a history of diabetes, HTN, depression, anxiety who presented to the ED with left-sided weakness, left facial weakness (improved since seen in ED per notes), subjective fevers, generalized weakness, dysuria and altered mental status. Head CT which was done was unremarkable. Per MD notes, likely metabolic encephalopathy secondary to community acquired pneumonia and urinary tract infection.    PT Comments    [name] demonstrated modest improvement w/ ambulatory distance, SpO2 down to 86% on RA and [place] donned.  Pt's family will be moving pt's bed to 1st floor to avoid steps.  Pt will benefit from continued skilled PT services to increase functional independence and safety.   Follow Up Recommendations  Home health PT;Supervision for mobility/OOB     Equipment Recommendations  None recommended by PT    Recommendations for Other Services       Precautions / Restrictions Precautions Precautions: Fall Restrictions Weight Bearing Restrictions: No    Mobility  Bed Mobility Overal bed mobility: Needs Assistance Bed Mobility: Supine to Sit     Supine to sit: Supervision     General bed mobility comments: Increased time  Transfers Overall transfer level: Needs assistance Equipment used: Rolling walker (2 wheeled) Transfers: Sit to/from Stand Sit to Stand: Min guard         General transfer comment: Cues for hand placement as pt initially attempts to pull on RW.    Ambulation/Gait Ambulation/Gait assistance: Min guard Ambulation Distance (Feet): 60 Feet Assistive device: Rolling walker (2 wheeled) Gait Pattern/deviations: Step-through pattern;Decreased stride length;Trunk flexed   Gait velocity interpretation: Below normal speed for age/gender General Gait Details: Cues for pursed lip breathing.  Pt  fatigues easily.  SpO2 down to 86% on RA after ambulating 50 ft, donned [place] at 2 LPM and SpO2 returned to 90's by end of session.   Stairs            Wheelchair Mobility    Modified Rankin (Stroke Patients Only)       Balance Overall balance assessment: Needs assistance Sitting-balance support: Feet supported;Bilateral upper extremity supported Sitting balance-Leahy Scale: Fair     Standing balance support: Bilateral upper extremity supported;During functional activity Standing balance-Leahy Scale: Poor Standing balance comment: RW for support                    Cognition Arousal/Alertness: Awake/alert Behavior During Therapy: WFL for tasks assessed/performed Overall Cognitive Status: Within Functional Limits for tasks assessed                      Exercises Other Exercises Other Exercises: Encouraged pt to ambulate at least 3x/day w/ nursing staff    General Comments General comments (skin integrity, edema, etc.): Had discussion w/ pt and family about home layout.  Family will have pt's bed moved down to the 1st floor before pt d/c home to avoid need to ascend/descend steps.  There is also a bathroom downstairs w/ a bath/shower.      Pertinent Vitals/Pain Pain Assessment: No/denies pain Pain Intervention(s): Limited activity within patient's tolerance;Monitored during session    Home Living                      Prior Function            PT Goals (current goals can  now be found in the care plan section) Acute Rehab PT Goals Patient Stated Goal: go home  PT Goal Formulation: With patient Time For Goal Achievement: 10/07/15 Potential to Achieve Goals: Good Progress towards PT goals: Progressing toward goals    Frequency  Min 3X/week    PT Plan Current plan remains appropriate    Co-evaluation             End of Session Equipment Utilized During Treatment: Gait belt;Oxygen Activity Tolerance: Patient limited by  fatigue Patient left: with call bell/phone within reach;in chair;with family/visitor present     Time: [id number] PT Time Calculation (min) (ACUTE ONLY): 21 min  Charges:  $Gait Training: 8-22 mins                    G Codes:      [name] PT, [place]PT [phone] Pager: [phone] 10/04/2015, 2:29 PM

## 2015-10-04 NOTE — Progress Notes (Signed)
[facility] PROGRESS NOTE  [name] ONG:[id number] DOB: 03/27/1941 DOA: 09/28/2015  PCP: Ginette OttoSTONEKING,HAL THOMAS, MD  Brief HPI: 74 year old Caucasian female with a past medical history of diabetes, hypertension, depression, presented to the emergency department with fever, weakness, with some concern for left-sided weakness as well. She had been complaining of dysuria. She was also noted to be confused. Evaluation in the ED revealed community-acquired pneumonia and a UTI. Left-sided weakness subsided. MRI was negative for stroke. On 12/27. Patient went into atrial fibrillation with RVR. She felt more dyspneic. She was transferred to the stepdown unit  Past medical history:  Past Medical History  Diagnosis Date  . Diabetes mellitus without complication (HCC)   . Hypertension     Consultants: None  Procedures:  Echocardiogram Study Conclusions - Left ventricle: The cavity size was normal. Systolic function wasnormal. The estimated ejection fraction was in the range of 60%to 65%. Wall motion was normal; there were no regional wallmotion abnormalities. - Mitral valve: There was mild regurgitation. - Left atrium: The atrium was mildly to moderately dilated. - Pulmonary arteries: Systolic pressure was mildly increased. PApeak pressure: 37 mm Hg (S).  Antibiotics: Ceftriaxone and azithromycin 12/24  Subjective: She is awake and alert, thinks she is feeling better today. Denies fever chills.   Objective: Vital Signs  Filed Vitals:   10/03/15 2026 10/03/15 2116 10/04/15 0524 10/04/15 0924  BP: 147/51  131/64   Pulse: 86  86   Temp: 98.5 F (36.9 C)  97.7 F (36.5 C)   TempSrc: Oral  Oral   Resp: 20  20   Height:      Weight:   85.367 kg (188 lb 3.2 oz)   SpO2: 93% 95% 93% 96%    Intake/Output Summary (Last 24 hours) at 10/04/15 1401 Last data filed at 10/04/15 1206  Gross per 24 hour  Intake    290 ml  Output   2000 ml  Net  -1710 ml   Filed Weights   09/28/15 1800 10/02/15 1817 10/04/15 0524  Weight: 85.1 kg (187 lb 9.8 oz) 86.6 kg (190 lb 14.7 oz) 85.367 kg (188 lb 3.2 oz)    General appearance: alert, cooperative, appears stated age, no distress and moderately obese Resp: Air entry appears to be improved this morning. Less wheezing compared to yesterday.  Cardio: S1, S2 is irregularly irregular. Rate appears to be well controlled. GI: soft, non-tender; bowel sounds normal; no masses,  no organomegaly Extremities: extremities normal, atraumatic, no cyanosis or edema Neurologic: She is alert and oriented 3. No facial asymmetry. Motor strength equal bilateral upper extremities. Somewhat weak in the left leg which is chronic per patient.  Lab Results:  Basic Metabolic Panel:  Recent Labs Lab 09/28/15 1400 09/29/15 0508  09/30/15 0555 10/01/15 0630 10/02/15 0515 10/03/15 0252 10/04/15 0350  NA  --   --   < > 137 141 139 135 138  K  --   --   < > 4.4 4.7 4.9 4.6 4.7  CL  --   --   < > 102 106 103 100* 102  CO2  --   --   < > 26 29 27 27 29   GLUCOSE  --   --   < > 192* 134* 257* 243* 155*  BUN  --   --   < > 21* 23* 31* 40* 36*  CREATININE  --   --   < > 0.95 0.90 1.29* 1.14* 1.02*  CALCIUM  --   --   < >  8.5* 8.7* 8.6* 8.7* 8.9  MG 1.5* 1.8  --   --   --   --   --   --   PHOS 3.4  --   --   --   --   --   --   --   < > = values in this interval not displayed. Liver Function Tests:  Recent Labs Lab 09/28/15 0930 09/29/15 1426  AST 16 21  ALT 19 22  ALKPHOS 74 64  BILITOT 0.6 0.5  PROT 7.9 7.0  ALBUMIN 3.5 2.7*    Recent Labs Lab 09/28/15 0930  AMMONIA 34   CBC:  Recent Labs Lab 09/28/15 0930 09/29/15 1426 09/30/15 0555 10/02/15 0515 10/03/15 0252 10/04/15 0350  WBC 8.5 5.9 8.0 7.6 7.2 8.6  NEUTROABS 5.1  --   --   --   --   --   HGB 13.1 11.8* 11.5* 11.3* 11.1* 11.7*  HCT 43.0 39.1 37.9 38.5 37.4 39.2  MCV 83.7 82.0 81.9 83.9 83.1 82.4  PLT 187 209 215 229 230 282   Cardiac Enzymes:  Recent  Labs Lab 09/28/15 1400  CKTOTAL 79   BNP (last 3 results)  Recent Labs  09/28/15 1400  BNP 68.5    CBG:  Recent Labs Lab 10/03/15 2041 10/03/15 2355 10/04/15 0424 10/04/15 0737 10/04/15 1126  GLUCAP 270* 190* 97 120* 207*    Recent Results (from the past 240 hour(s))  Urine culture     Status: None   Collection Time: 09/28/15  9:58 AM  Result Value Ref Range Status   Specimen Description URINE, CLEAN CATCH  Final   Special Requests NONE  Final   Culture   Final    >=100,000 COLONIES/mL ESCHERICHIA COLI >=100,000 COLONIES/mL AEROCOCCUS URINAE    Report Status 10/01/2015 FINAL  Final   Organism ID, Bacteria ESCHERICHIA COLI  Final      Susceptibility   Escherichia coli - MIC*    AMPICILLIN <=2 SENSITIVE Sensitive     CEFAZOLIN <=4 SENSITIVE Sensitive     CEFTRIAXONE <=1 SENSITIVE Sensitive     CIPROFLOXACIN <=0.25 SENSITIVE Sensitive     GENTAMICIN <=1 SENSITIVE Sensitive     IMIPENEM <=0.25 SENSITIVE Sensitive     NITROFURANTOIN <=16 SENSITIVE Sensitive     TRIMETH/SULFA <=20 SENSITIVE Sensitive     AMPICILLIN/SULBACTAM <=2 SENSITIVE Sensitive     PIP/TAZO <=4 SENSITIVE Sensitive     * >=100,000 COLONIES/mL ESCHERICHIA COLI  Blood culture (routine x 2)     Status: None   Collection Time: 09/28/15  1:50 PM  Result Value Ref Range Status   Specimen Description BLOOD RIGHT ANTECUBITAL  Final   Special Requests BOTTLES DRAWN AEROBIC AND ANAEROBIC  Final   Culture NO GROWTH 5 DAYS  Final   Report Status 10/03/2015 FINAL  Final  Blood culture (routine x 2)     Status: None   Collection Time: 09/28/15  2:00 PM  Result Value Ref Range Status   Specimen Description BLOOD RIGHT HAND  Final   Special Requests BOTTLES DRAWN AEROBIC AND ANAEROBIC  Final   Culture NO GROWTH 5 DAYS  Final   Report Status 10/03/2015 FINAL  Final  Culture, sputum-assessment     Status: None   Collection Time: 09/28/15  5:12 PM  Result Value Ref Range Status   Specimen  Description SPUTUM  Final   Special Requests NONE  Final   Sputum evaluation   Final    MICROSCOPIC FINDINGS  SUGGEST THAT THIS SPECIMEN IS NOT REPRESENTATIVE OF LOWER RESPIRATORY SECRETIONS. PLEASE RECOLLECT. Gram Stain Report Called to,Read Back By and Verified With: H. MCINTOSH,RN AT 1308 ON [id number] BY Lucienne Capers    Report Status 09/29/2015 FINAL  Final  Respiratory virus panel     Status: None   Collection Time: 09/28/15  5:43 PM  Result Value Ref Range Status   Source - RVPAN NASAL WASHINGS  Corrected   Respiratory Syncytial Virus A Negative Negative Final   Respiratory Syncytial Virus B Negative Negative Final   Influenza A Negative Negative Final   Influenza B Negative Negative Final   Parainfluenza 1 Negative Negative Final   Parainfluenza 2 Negative Negative Final   Parainfluenza 3 Negative Negative Final   Metapneumovirus Negative Negative Final   Rhinovirus Negative Negative Final   Adenovirus Negative Negative Final    Comment: (NOTE) Performed At: [facility] [address] [place], [place] [id number] Mila Homer MD BJ:[id number]   MRSA PCR Screening     Status: None   Collection Time: 10/01/15  6:16 PM  Result Value Ref Range Status   MRSA by PCR NEGATIVE NEGATIVE Final    Comment:        The GeneXpert MRSA Assay (FDA approved for NASAL specimens only), is one component of a comprehensive MRSA colonization surveillance program. It is not intended to diagnose MRSA infection nor to guide or monitor treatment for MRSA infections.       Studies/Results: No results found.  Medications:  Scheduled: . antiseptic oral rinse  7 mL Mouth Rinse BID  . arformoterol  15 mcg Nebulization BID  . aspirin  325 mg Oral Daily  . atorvastatin  10 mg Oral Daily  . azithromycin  500 mg Oral Q24H  . budesonide (PULMICORT) nebulizer solution  0.25 mg Nebulization BID  . cefTRIAXone (ROCEPHIN)  IV  1 g Intravenous Q24H  . diltiazem  120 mg Oral Daily  .  DULoxetine  60 mg Oral Daily  . fluticasone  2 spray Each Nare Daily  . gabapentin  400 mg Oral TID  . insulin aspart  0-15 Units Subcutaneous 6 times per day  . insulin aspart  5 Units Subcutaneous TID WC  . insulin glargine  55 Units Subcutaneous Daily  . loratadine  10 mg Oral Daily  . methylPREDNISolone (SOLU-MEDROL) injection  60 mg Intravenous 3 times per day  . rivaroxaban  15 mg Oral Q supper   Continuous:   YQM:VHQIONGEXB, diltiazem, levalbuterol  Assessment/Plan:  Principal Problem:   Atrial fibrillation with rapid ventricular response (HCC) Active Problems:   Anxiety disorder   Peripheral neuropathy (HCC)   Asthma   Metabolic encephalopathy   Acute respiratory failure with hypoxia (HCC)   CAP (community acquired pneumonia)   Diabetes mellitus, insulin dependent (IDDM), uncontrolled (HCC)   HTN (hypertension)   Acute UTI   Left-sided weakness   Left leg weakness    Atrial Fibrillation with RVR -CHADSCasc score of 4  -Improved, currently ventricular rates in the 70's -Will discontinue IV Cardizem and start Cardizem 120 mg PO q daily -I discussed benefits and risks of anticoagulation with [name] and her husband. They agree with starting anticoagulation with Xarelto. Pharm consulted for Xarelto dosing. Will stop ASA 325 mg daily.   Acute respiratory failure with hypoxia/CAP vs asp pNA -Overall looks better, wheezing improved -10/04/2015 stopped IV solumedrol and placed her on prednisone taper -She has received 6 days of IV antibiotic therapy. Ceftriaxone and Azithromycin discontinued  on 10/04/2015  Asthma with exacerbation -Improved, will stop IV soluMedrol today, place on prednisone taper.   Metabolic encephalopathy Likely secondary to UTI and pneumonia. Mental status appears to be improved and back to baseline. Continue to monitor for now.  Urinary tract infection with Escherichia coli and Aerococcus -She has been treated with 6 days of IV antibiotic  therapy. Will discontinue antimicrobial therapy and monitor.   Questionable Left-sided weakness Patient does have chronic left lower extremity weakness. No deficits noted in the left upper extremity. MRI was negative for stroke. MRI of the lumbar spine did show some disc protrusion, however, no concerning findings noted. PT and OT evaluation. Cleared by speech therapist. It is quite likely that her infectious process may have caused encephalopathy and weakness, which could have been misinterpreted as stroke like symptoms. No significant stenosis on carotid Doppler. Don't anticipate any further neurological workup at this time.  Essential hypertension   Blood pressure is reasonably well controlled.  -Will continue Cardizem 120 mg PO q daily   DVT Prophylaxis: Lovenox    Code Status: DO NOT RESUSCITATE  Family Communication: I spoke with her husband  Disposition Plan: Anticipate discharge in the next 24 hours    LOS: 5 days   Jeralyn Bennett  [facility] Pager [phone] 10/04/2015, 2:01 PM  If 7PM-7AM, please contact night-coverage at [email or web address], password [facility]

## 2015-10-04 NOTE — Progress Notes (Signed)
Occupational Therapy Treatment Patient Details Name: [name] MRN: [id number] DOB: 09/19/1941 Today's Date: 10/04/2015    History of present illness Pt is a 74 y/o female with a history of diabetes, HTN, depression, anxiety who presented to the ED with left-sided weakness, left facial weakness (improved since seen in ED per notes), subjective fevers, generalized weakness, dysuria and altered mental status. Head CT which was done was unremarkable. Per MD notes, likely metabolic encephalopathy secondary to community acquired pneumonia and urinary tract infection.   OT comments  Patient is progressing towards goals, continue plan of care for now. Pt not as limited this session by SOB or fatigue. Prior to OT session, pt walked with PT; ambulating 50 ft. Pt more than willing to work with OT. Pt ambulated recliner to BR for toilet transfer, then to sink for grooming task of washing hands, then back to recliner. Pt on RA during these self-care tasks and sats decreased to as low as 80%. When this low, encouraged seated rest break and pursed lip breathing. Once therapist donned 2L/min supplemental 02 sats quickly increased back to 91%. Discussed importance of energy conservation techniques to increase activity and safety.    Follow Up Recommendations  Home health OT;Supervision - Intermittent    Equipment Recommendations  Other (comment) (AE - reacher, sock aid, LH sponge, LH shoe horn)    Recommendations for Other Services  None at this time   Precautions / Restrictions Precautions Precautions: Fall Restrictions Weight Bearing Restrictions: No    Mobility Bed Mobility Overal bed mobility: Needs Assistance Bed Mobility: Supine to Sit     Supine to sit: Supervision     General bed mobility comments: Increased time  Transfers Overall transfer level: Needs assistance Equipment used: Rolling walker (2 wheeled) Transfers: Sit to/from Stand Sit to Stand: Min guard General transfer  comment: Cues for hand placement as pt initially attempts to pull on RW.      Balance Overall balance assessment: Needs assistance Sitting-balance support: No upper extremity supported;Feet supported Sitting balance-Leahy Scale: Fair     Standing balance support: Bilateral upper extremity supported;During functional activity Standing balance-Leahy Scale: Poor Standing balance comment: RW for support   ADL Overall ADL's : Needs assistance/impaired     Grooming: Supervision/safety;Standing;Wash/dry hands Grooming Details (indicate cue type and reason): at sink Lower Body Dressing: Sit to/from stand;Maximal assistance Lower Body Dressing Details (indicate cue type and reason): Assistance needed for socks and shoes, pt with difficulty reaching. Pt will benefit from AE at home. Toilet Transfer: Min guard;RW;Comfort height toilet;Ambulation   Toileting- Clothing Manipulation and Hygiene: Min guard;Sit to/from stand General ADL Comments: Pt educated on energy conservation techniques. Sats ranged from 80-91% on RA. When less than 90% encouraged seated rest break and pursed lip breathing.      Cognition   Behavior During Therapy: WFL for tasks assessed/performed Overall Cognitive Status: Within Functional Limits for tasks assessed                 Pertinent Vitals/ Pain       Pain Assessment: No/denies pain Pain Intervention(s): Limited activity within patient's tolerance;Monitored during session   Frequency Min 3X/week     Progress Toward Goals  OT Goals(current goals can now befound in the care plan section)  Progress towards OT goals: Progressing toward goals  Acute Rehab OT Goals Patient Stated Goal: go home soon  Plan Discharge plan remains appropriate    End of Session Equipment Utilized During Treatment: Gait belt;Rolling walker;Oxygen  Activity Tolerance Patient tolerated treatment well   Patient Left in chair;with call bell/phone within reach    Time:  1430-1445 OT Time Calculation (min): 15 min  Charges: OT General Charges $OT Visit: 1 Procedure OT Treatments $Self Care/Home Management : 8-22 mins  Edwin Cap , MS, OTR/L, CLT Pager: [phone]  10/04/2015, 2:56 PM

## 2015-10-05 LAB — GLUCOSE, CAPILLARY
Glucose-Capillary: 115 mg/dL — ABNORMAL HIGH (ref 65–99)
Glucose-Capillary: 120 mg/dL — ABNORMAL HIGH (ref 65–99)
Glucose-Capillary: 49 mg/dL — ABNORMAL LOW (ref 65–99)

## 2015-10-05 MED ORDER — PREDNISONE 10 MG (21) PO TBPK: ORAL_TABLET | ORAL | Status: DC

## 2015-10-05 MED ORDER — RIVAROXABAN 15 MG PO TABS: 15.0000 mg | ORAL_TABLET | Freq: Every day | ORAL | Status: DC

## 2015-10-05 MED ORDER — DILTIAZEM HCL ER COATED BEADS 120 MG PO CP24: 120.0000 mg | ORAL_CAPSULE | Freq: Every day | ORAL | Status: AC

## 2015-10-05 MED ORDER — RIVAROXABAN 20 MG PO TABS: 20.0000 mg | ORAL_TABLET | Freq: Every day | ORAL | Status: DC

## 2015-10-05 NOTE — Progress Notes (Signed)
Hypoglycemic Event  CBG: 49  Treatment: 15 GM carbohydrate snack  Symptoms: None  Follow-up CBG: Time: 0821 CBG Result:120  Possible Reasons for Event: Medication Regime  Comments/MD notified: Dr. Keith RakeZamora    Hillery Zachman, Daryl EasternJoshua R

## 2015-10-05 NOTE — Discharge Summary (Signed)
Physician Discharge Summary  [name] ZOX:[id number] DOB: 05/29/1941 DOA: 09/28/2015  PCP: Ginette Otto, MD  Admit date: 09/28/2015 Discharge date: 10/05/2015  Time spent: 35 minutes  Recommendations for Outpatient Follow-up:  1. Please follow up on blood pressures as she was discharged on Cardizem 120 g by mouth daily with discontinuation of Hyzaar 2. She was treated for A. fib with rapid ventricular response, started on Xarelto during this hospitalization. Please follow-up on heart rates. 3. Follow-up on repeat BMP and CBC on hospital follow-up visit 4. She was set up with home health services for home PT prior to discharge.   Discharge Diagnoses:  Principal Problem:   Atrial fibrillation with rapid ventricular response (HCC) Active Problems:   Anxiety disorder   Peripheral neuropathy (HCC)   Asthma   Metabolic encephalopathy   Acute respiratory failure with hypoxia (HCC)   CAP (community acquired pneumonia)   Diabetes mellitus, insulin dependent (IDDM), uncontrolled (HCC)   HTN (hypertension)   Acute UTI   Left-sided weakness   Left leg weakness   Discharge Condition: Stable  Diet recommendation: Heart healthy  Filed Weights   10/02/15 1817 10/04/15 0524 10/05/15 0517  Weight: 86.6 kg (190 lb 14.7 oz) 85.367 kg (188 lb 3.2 oz) 86.909 kg (191 lb 9.6 oz)    History of present illness:  74 year old female patient with known diabetes on insulin with associated significant peripheral neuropathy, hypertension and asthma. Patient was in her usual state of health with her chronic nonproductive cough but was complaining of a headache last night before going to bed. The patient apparently rolled out of bed onto the floor this morning reporting that her "legs wouldn't work". EMS was called to the house by the family. Upon their evaluation the patient had room air saturations of 86% with wheezing in the upper airways and diminished in the lower airways. She was given 1  albuterol neb and placed on 4 L oxygen. CBG was 212. The patient was oriented to name only. No apparent focal neurological deficits were appreciated. Since that initial evaluation further history is been obtained. Patient has significant peripheral neuropathy and reports having chronic numbness in the hands and feet for many years. Patient reports chronic difficulties with changing from a seated or supine position to standing and at times has to sit back down or lay back down because of lower extremity weakness. She also reports chronic lower extremity weakness that is worse on the left but denies difficulty utilizing the left leg with ambulation. Husband and patient both state that when she stands up and again" gets going" she is able to walk without difficulty. Later husband and explained to attending physician that earlier this morning her symptoms were somewhat different than baseline with new left arm weakness and decreased grip as well as worsened left leg weakness. This was associated with apparent left facial droopingL. Denies history of recurrent or recent falls. Patient currently is denying worsening in cough or any productive cough. No fevers or chills at home. She has had some rhinorrhea. She has a history of pneumonia in the early winter typically. She did receive the influenza vaccine this year. She described her headache as being a sinus headache. No abdominal pain nausea or vomiting. She states no change in her chronic hand and feet numbness and dysmobility issues. After I left the room the husband pulled inside and stated that the patient is talking to her brother in asking him questions noting his brother has been deceased for 3 years.  Hospital Course:  74 year old Caucasian female with a past medical history of diabetes, hypertension, depression, presented to the emergency department with fever, weakness, with some concern for left-sided weakness as well. She had been complaining of dysuria.  She was also noted to be confused. Evaluation in the ED revealed community-acquired pneumonia and a UTI. Left-sided weakness subsided. MRI was negative for stroke. On 12/27. Patient went into atrial fibrillation with RVR. She felt more dyspneic. She was transferred to the stepdown unit  Atrial Fibrillation with RVR -CHADSCasc score of 4  -Improved, currently ventricular rates in the 70's -IV Cardizem was changed to Cardizem 120 mg PO q daily -I discussed benefits and risks of anticoagulation with [name] and her husband. They agree with starting anticoagulation with Xarelto. Pharm consulted for Xarelto dosing. Stopped ASA 325 mg daily.  -She was discharged on Xarelto 15 mg by mouth daily.  Acute respiratory failure with hypoxia/CAP vs asp pNA -Overall looks better, wheezing improved -10/04/2015 stopped IV solumedrol and placed her on prednisone taper -She has received 6 days of IV antibiotic therapy. Ceftriaxone and Azithromycin discontinued on 10/04/2015 -Patient de-satting to 86% ambulating, home oxygen requested.  Asthma with exacerbation -Improved, stopped IV soluMedrol on 10/04/2015, placed on prednisone taper.   Metabolic encephalopathy Likely secondary to UTI and pneumonia. Mental status appears to be improved and back to baseline. Continue to monitor for now.  Urinary tract infection with Escherichia coli and Aerococcus -She has been treated with 6 days of IV antibiotic therapy. Will discontinue antimicrobial therapy and monitor.   Questionable Left-sided weakness Patient does have chronic left lower extremity weakness. No deficits noted in the left upper extremity. MRI was negative for stroke. MRI of the lumbar spine did show some disc protrusion, however, no concerning findings noted. PT and OT evaluation. Cleared by speech therapist. It is quite likely that her infectious process may have caused encephalopathy and weakness, which could have been misinterpreted as stroke like  symptoms. No significant stenosis on carotid Doppler. Don't anticipate any further neurological workup at this time.  Essential hypertension  Blood pressure is reasonably well controlled.  -Will continue Cardizem 120 mg PO q daily. Her Hyzaar was discontinued. Please monitor blood pressures on hospital follow-up visit   Discharge Exam: Filed Vitals:   10/04/15 2049 10/05/15 0517  BP: 120/70 133/70  Pulse: 88 91  Temp: 98.4 F (36.9 C) 97.8 F (36.6 C)  Resp: 18 20    General: She is sitting at bedside chair, states feeling well, looking for to going home today. Cardiovascular: Irregular rate and rhythm normal S1-S2 Respiratory: Patient having ongoing bilateral expiratory wheezing, little change from yesterday Abdomen: Soft nontender nondistended  Extremities: No edema  Discharge Instructions   Discharge Instructions    Call MD for:  difficulty breathing, headache or visual disturbances    Complete by:  As directed      Call MD for:  extreme fatigue    Complete by:  As directed      Call MD for:  hives    Complete by:  As directed      Call MD for:  persistant dizziness or light-headedness    Complete by:  As directed      Call MD for:  persistant nausea and vomiting    Complete by:  As directed      Call MD for:  redness, tenderness, or signs of infection (pain, swelling, redness, odor or green/yellow discharge around incision site)    Complete by:  As directed      Call MD for:  severe uncontrolled pain    Complete by:  As directed      Call MD for:  temperature >100.4    Complete by:  As directed      Call MD for:    Complete by:  As directed      Diet - low sodium heart healthy    Complete by:  As directed      Increase activity slowly    Complete by:  As directed           Current Discharge Medication List    START taking these medications   Details  diltiazem (CARDIZEM CD) 120 MG 24 hr capsule Take 1 capsule (120 mg total) by mouth daily. Qty: 30  capsule, Refills: 1    predniSONE (STERAPRED UNI-PAK 21 TAB) 10 MG (21) TBPK tablet Take 6-5-4-3-2-1 tablets by mouth daily till gone. Qty: 21 tablet, Refills: 0    Rivaroxaban (XARELTO) 15 MG TABS tablet Take 1 tablet (15 mg total) by mouth daily with supper. Qty: 42 tablet, Refills: 1      CONTINUE these medications which have NOT CHANGED   Details  ALPRAZolam (XANAX) 0.25 MG tablet Take 0.25 mg by mouth 2 (two) times daily as needed for anxiety or sleep.    atorvastatin (LIPITOR) 10 MG tablet Take 10 mg by mouth daily.    cetirizine (ZYRTEC) 10 MG tablet Take 10 mg by mouth daily as needed for allergies.    DULoxetine (CYMBALTA) 60 MG capsule Take 60 mg by mouth daily. Refills: 5    gabapentin (NEURONTIN) 400 MG capsule Take 400 mg by mouth 3 (three) times daily as needed. Refills: 2    Insulin Glargine (TOUJEO SOLOSTAR [place]) Inject 40 Units into the skin daily.    metFORMIN (GLUCOPHAGE) 500 MG tablet Take 1,000 mg by mouth 2 (two) times daily. Refills: 0    PROAIR HFA 108 (90 BASE) MCG/ACT inhaler Inhale 2 puffs into the lungs every 4 (four) hours as needed for wheezing.  Refills: 1      STOP taking these medications     aspirin EC 81 MG tablet      HYDROcodone-acetaminophen (NORCO/VICODIN) 5-325 MG tablet      losartan-hydrochlorothiazide (HYZAAR) 100-25 MG tablet        Allergies  Allergen Reactions  . Etodolac Anaphylaxis   Follow-up Information    Follow up with [facility].   Specialty:  Home Health Services   Why:  Known as [facility]- Registered Nurse, Physical/ Occupational Therapy   Contact information:   [address] [place] [address] [phone]        The results of significant diagnostics from this hospitalization (including imaging, microbiology, ancillary and laboratory) are listed below for reference.    Significant Diagnostic Studies: Ct Head Wo Contrast  09/28/2015  CLINICAL DATA:  Status post fall, altered mental  status. EXAM: CT HEAD WITHOUT CONTRAST TECHNIQUE: Contiguous axial images were obtained from the base of the skull through the vertex without intravenous contrast. COMPARISON:  October 21, 2012 FINDINGS: There is no midline shift, hydrocephalus, or mass. No acute hemorrhage or acute transcortical infarct is identified. There is chronic diffuse atrophy. The bony calvarium is intact. The visualized sinuses are clear. IMPRESSION: No focal acute intracranial abnormality identified. Chronic diffuse atrophy. Electronically Signed   By: Sherian ReinWei-Chen  Lin M.D.   On: 09/28/2015 10:02   Mr Maxine GlennMra Head Wo Contrast  09/28/2015  CLINICAL  DATA:  New onset left-sided weakness. Left facial weakness. EXAM: MRI HEAD WITHOUT CONTRAST MRA HEAD WITHOUT CONTRAST TECHNIQUE: Multiplanar, multiecho pulse sequences of the brain and surrounding structures were obtained without intravenous contrast. Angiographic images of the head were obtained using MRA technique without contrast. COMPARISON:  CT head without contrast from the same day. FINDINGS: MRI HEAD FINDINGS The diffusion-weighted images demonstrate no evidence for acute or subacute infarct. There is no acute hemorrhage or mass lesion. The study is mildly degraded by patient motion. Moderate periventricular and subcortical T2 changes are somewhat advanced for age. Moderate T2 changes are present within the central pons. Remote lacunar infarcts are evident in the inferior right cerebellum. The internal auditory canals are within normal limits. Flow is present in the major intracranial arteries. Bilateral lens replacements are present. The paranasal sinuses and mastoid air cells are clear. MRA HEAD FINDINGS The study is moderately degraded by patient motion, particularly in the upper portions. The high cervical internal carotid arteries are within normal limits bilaterally. There may be some narrowing of the cavernous left internal carotid arteries. The A1 and M1 segments appear to be  intact. The MCA bifurcations are intact. The branch vessels are severely degraded by motion. The left vertebral artery is slightly dominant to the right. Mild narrowing is suspected at the left vertebral artery dural margin. The PICA origins are visualized and normal bilaterally. The basilar artery is normal. The left posterior cerebral artery originates from the basilar tip. The right posterior cerebral artery is of fetal type. IMPRESSION: 1. No acute or subacute infarct to explain the patient's symptoms. 2. Moderate periventricular and subcortical white matter changes likely reflect the sequela of chronic microvascular ischemia. 3. Moderate white matter changes are noted in the pons is well. 4. Remote lacunar infarct of the right cerebellum. 5. Probable mild narrowing of the left vertebral artery at the dural margin in the cavernous left internal carotid artery. 6. The MRA is severely degraded by patient motion in evaluation of distal vessels is not possible. Electronically Signed   By: Marin Roberts M.D.   On: 09/28/2015 17:04   Mr Brain Wo Contrast  09/28/2015  CLINICAL DATA:  New onset left-sided weakness. Left facial weakness. EXAM: MRI HEAD WITHOUT CONTRAST MRA HEAD WITHOUT CONTRAST TECHNIQUE: Multiplanar, multiecho pulse sequences of the brain and surrounding structures were obtained without intravenous contrast. Angiographic images of the head were obtained using MRA technique without contrast. COMPARISON:  CT head without contrast from the same day. FINDINGS: MRI HEAD FINDINGS The diffusion-weighted images demonstrate no evidence for acute or subacute infarct. There is no acute hemorrhage or mass lesion. The study is mildly degraded by patient motion. Moderate periventricular and subcortical T2 changes are somewhat advanced for age. Moderate T2 changes are present within the central pons. Remote lacunar infarcts are evident in the inferior right cerebellum. The internal auditory canals are  within normal limits. Flow is present in the major intracranial arteries. Bilateral lens replacements are present. The paranasal sinuses and mastoid air cells are clear. MRA HEAD FINDINGS The study is moderately degraded by patient motion, particularly in the upper portions. The high cervical internal carotid arteries are within normal limits bilaterally. There may be some narrowing of the cavernous left internal carotid arteries. The A1 and M1 segments appear to be intact. The MCA bifurcations are intact. The branch vessels are severely degraded by motion. The left vertebral artery is slightly dominant to the right. Mild narrowing is suspected at the left vertebral artery dural  margin. The PICA origins are visualized and normal bilaterally. The basilar artery is normal. The left posterior cerebral artery originates from the basilar tip. The right posterior cerebral artery is of fetal type. IMPRESSION: 1. No acute or subacute infarct to explain the patient's symptoms. 2. Moderate periventricular and subcortical white matter changes likely reflect the sequela of chronic microvascular ischemia. 3. Moderate white matter changes are noted in the pons is well. 4. Remote lacunar infarct of the right cerebellum. 5. Probable mild narrowing of the left vertebral artery at the dural margin in the cavernous left internal carotid artery. 6. The MRA is severely degraded by patient motion in evaluation of distal vessels is not possible. Electronically Signed   By: Marin Roberts M.D.   On: 09/28/2015 17:04   Mr Lumbar Spine Wo Contrast  09/28/2015  CLINICAL DATA:  Rolled out of bed onto floor.  Unable to use legs. EXAM: MRI LUMBAR SPINE WITHOUT CONTRAST TECHNIQUE: Multiplanar, multisequence MR imaging of the lumbar spine was performed. No intravenous contrast was administered. COMPARISON:  MRI lumbar spine 03/27/2009 FINDINGS: Normal signal is present in the conus medullaris which terminates at L1-2. 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 A smaller hemangioma is present at T12. Chronic endplate marrow changes are again noted at L5-S1. Levoconvex curvature is present at thoracolumbar junction. Limiting imaging of the abdomen demonstrates no focal lesions. There is no significant adenopathy. L1-2:  Minimal disc bulging is present without significant stenosis. L2-3:  Negative. L3-4: A broad-based disc protrusion is present. There is progression of mild bilateral subarticular stenosis. The foramina are patent. L4-5: Mild disc bulging and facet hypertrophy are stable. There is no significant stenosis. L5-S1: A left laminectomy is again noted. There is progression of a right paramedian disc protrusion. Mild subarticular narrowing is worse right than left. The foramina are patent. IMPRESSION: 1. Progression of broad-based disc protrusion and mild bilateral subarticular stenosis at L3-4. 2. Similar appearance of mild disc bulging and facet hypertrophy at L4-5 without significant stenosis. 3. Progressive right paramedian disc protrusion with mild subarticular narrowing bilaterally, right greater than left. Electronically Signed   By: Marin Roberts M.D.   On: 09/28/2015 17:18   Dg Chest Port 1 View  10/01/2015  CLINICAL DATA:  Dyspnea EXAM: PORTABLE CHEST 1 VIEW COMPARISON:  09/28/2015 FINDINGS: Chronic interstitial markings. No focal consolidation. No pleural effusion or pneumothorax. The heart is normal in size. IMPRESSION: No evidence of acute cardiopulmonary disease. Electronically Signed   By: Charline Bills M.D.   On: 10/01/2015 09:52   Dg Chest Port 1 View  09/28/2015  CLINICAL DATA:  Wheezing and decreased oxygen saturation. EXAM: PORTABLE CHEST 1 VIEW COMPARISON:  05/01/2015 FINDINGS: Cardiomediastinal silhouette is normal. Mediastinal contours appear intact. There is low lung volume with coarsening of the interstitial markings. Patchy airspace consolidation is seen in the right lower lobe. Osseous  structures are without acute abnormality. Soft tissues are grossly normal. IMPRESSION: Low lung volumes with coarsening of the interstitial markings, which may represent developing interstitial edema. Patchy airspace consolidation in the right lower lobe. Given patient's history aspiration pneumonia is of consideration. Electronically Signed   By: Ted Mcalpine M.D.   On: 09/28/2015 10:01    Microbiology: Recent Results (from the past 240 hour(s))  Urine culture     Status: None   Collection Time: 09/28/15  9:58 AM  Result Value Ref Range Status   Specimen Description URINE, CLEAN CATCH  Final   Special Requests NONE  Final  Culture   Final    >=100,000 COLONIES/mL ESCHERICHIA COLI >=100,000 COLONIES/mL AEROCOCCUS URINAE    Report Status 10/01/2015 FINAL  Final   Organism ID, Bacteria ESCHERICHIA COLI  Final      Susceptibility   Escherichia coli - MIC*    AMPICILLIN <=2 SENSITIVE Sensitive     CEFAZOLIN <=4 SENSITIVE Sensitive     CEFTRIAXONE <=1 SENSITIVE Sensitive     CIPROFLOXACIN <=0.25 SENSITIVE Sensitive     GENTAMICIN <=1 SENSITIVE Sensitive     IMIPENEM <=0.25 SENSITIVE Sensitive     NITROFURANTOIN <=16 SENSITIVE Sensitive     TRIMETH/SULFA <=20 SENSITIVE Sensitive     AMPICILLIN/SULBACTAM <=2 SENSITIVE Sensitive     PIP/TAZO <=4 SENSITIVE Sensitive     * >=100,000 COLONIES/mL ESCHERICHIA COLI  Blood culture (routine x 2)     Status: None   Collection Time: 09/28/15  1:50 PM  Result Value Ref Range Status   Specimen Description BLOOD RIGHT ANTECUBITAL  Final   Special Requests BOTTLES DRAWN AEROBIC AND ANAEROBIC  Final   Culture NO GROWTH 5 DAYS  Final   Report Status 10/03/2015 FINAL  Final  Blood culture (routine x 2)     Status: None   Collection Time: 09/28/15  2:00 PM  Result Value Ref Range Status   Specimen Description BLOOD RIGHT HAND  Final   Special Requests BOTTLES DRAWN AEROBIC AND ANAEROBIC  Final   Culture NO GROWTH 5 DAYS  Final    Report Status 10/03/2015 FINAL  Final  Culture, sputum-assessment     Status: None   Collection Time: 09/28/15  5:12 PM  Result Value Ref Range Status   Specimen Description SPUTUM  Final   Special Requests NONE  Final   Sputum evaluation   Final    MICROSCOPIC FINDINGS SUGGEST THAT THIS SPECIMEN IS NOT REPRESENTATIVE OF LOWER RESPIRATORY SECRETIONS. PLEASE RECOLLECT. Gram Stain Report Called to,Read Back By and Verified With: H. MCINTOSH,RN AT 1308 ON [id number] BY Lucienne Capers    Report Status 09/29/2015 FINAL  Final  Respiratory virus panel     Status: None   Collection Time: 09/28/15  5:43 PM  Result Value Ref Range Status   Source - RVPAN NASAL WASHINGS  Corrected   Respiratory Syncytial Virus A Negative Negative Final   Respiratory Syncytial Virus B Negative Negative Final   Influenza A Negative Negative Final   Influenza B Negative Negative Final   Parainfluenza 1 Negative Negative Final   Parainfluenza 2 Negative Negative Final   Parainfluenza 3 Negative Negative Final   Metapneumovirus Negative Negative Final   Rhinovirus Negative Negative Final   Adenovirus Negative Negative Final    Comment: (NOTE) Performed At: [facility] [address] [place], [place] [id number] Mila Homer MD OZ:[id number]   MRSA PCR Screening     Status: None   Collection Time: 10/01/15  6:16 PM  Result Value Ref Range Status   MRSA by PCR NEGATIVE NEGATIVE Final    Comment:        The GeneXpert MRSA Assay (FDA approved for NASAL specimens only), is one component of a comprehensive MRSA colonization surveillance program. It is not intended to diagnose MRSA infection nor to guide or monitor treatment for MRSA infections.      Labs: Basic Metabolic Panel:  Recent Labs Lab 09/28/15 1400 09/29/15 0508  09/30/15 0555 10/01/15 0630 10/02/15 0515 10/03/15 0252 10/04/15 0350  NA  --   --   < > 137 141 139  135 138  K  --   --   < > 4.4 4.7 4.9 4.6 4.7  CL  --   --   < >  102 106 103 100* 102  CO2  --   --   < > GLUCOSE  --   --   < > 192* 134* 257* 243* 155*  BUN  --   --   < > 21* 23* 31* 40* 36*  CREATININE  --   --   < > 0.95 0.90 1.29* 1.14* 1.02*  CALCIUM  --   --   < > 8.5* 8.7* 8.6* 8.7* 8.9  MG 1.5* 1.8  --   --   --   --   --   --   PHOS 3.4  --   --   --   --   --   --   --   < > = values in this interval not displayed. Liver Function Tests:  Recent Labs Lab 09/29/15 1426  AST 21  ALT 22  ALKPHOS 64  BILITOT 0.5  PROT 7.0  ALBUMIN 2.7*   No results for input(s): LIPASE, AMYLASE in the last 168 hours. No results for input(s): AMMONIA in the last 168 hours. CBC:  Recent Labs Lab 09/29/15 1426 09/30/15 0555 10/02/15 0515 10/03/15 0252 10/04/15 0350  WBC 5.9 8.0 7.6 7.2 8.6  HGB 11.8* 11.5* 11.3* 11.1* 11.7*  HCT 39.1 37.9 38.5 37.4 39.2  MCV 82.0 81.9 83.9 83.1 82.4  PLT 209 215 229 230 282   Cardiac Enzymes:  Recent Labs Lab 09/28/15 1400  CKTOTAL 79   BNP: BNP (last 3 results)  Recent Labs  09/28/15 1400  BNP 68.5    ProBNP (last 3 results) No results for input(s): PROBNP in the last 8760 hours.  CBG:  Recent Labs Lab 10/04/15 2028 10/04/15 2306 10/05/15 0356 10/05/15 0743 10/05/15 0821  GLUCAP 340* 243* 115* 49* 120*       Signed:  Jeralyn Bennett MD  FACP  [facility] 10/05/2015, 9:30 AM

## 2015-10-05 NOTE — Progress Notes (Signed)
SATURATION QUALIFICATIONS: (This note is used to comply with regulatory documentation for home oxygen)  Patient Saturations on Room Air at Rest = 91%  Patient Saturations on Room Air while Ambulating = 86%  Patient Saturations on 2 Liters of oxygen while Ambulating = 95%  Please briefly explain why patient needs home oxygen: Pt. desats while ambulating.

## 2015-10-05 NOTE — Care Management (Signed)
[facility] recommendations for patient requiring home oxygen. CM spoke with patient she is agreeable with recommendation. Offered choice DME company. Selected [facility]. [facility] room prior to [facility]. Patient updated no further CM needs identified.

## 2015-10-06 DIAGNOSIS — W19XXXA Unspecified fall, initial encounter: Secondary | ICD-10-CM

## 2015-10-06 HISTORY — DX: Unspecified fall, initial encounter: W19.XXXA

## 2015-10-10 ENCOUNTER — Ambulatory Visit
Admission: RE | Admit: 2015-10-10 | Discharge: 2015-10-10 | Disposition: A | Discharge status: Home / Self Care | Payer: Medicare Other | Source: Ambulatory Visit | Attending: Geriatric Medicine | Admitting: Geriatric Medicine

## 2015-10-10 ENCOUNTER — Other Ambulatory Visit: Payer: Self-pay | Admitting: Geriatric Medicine

## 2015-10-10 DIAGNOSIS — J690 Pneumonitis due to inhalation of food and vomit: Secondary | ICD-10-CM

## 2015-10-24 ENCOUNTER — Ambulatory Visit
Admission: RE | Admit: 2015-10-24 | Discharge: 2015-10-24 | Disposition: A | Discharge status: Home / Self Care | Payer: Medicare Other | Source: Ambulatory Visit | Attending: Geriatric Medicine | Admitting: Geriatric Medicine

## 2015-10-24 ENCOUNTER — Other Ambulatory Visit: Payer: Self-pay | Admitting: Geriatric Medicine

## 2015-10-24 DIAGNOSIS — J189 Pneumonia, unspecified organism: Secondary | ICD-10-CM

## 2015-10-29 ENCOUNTER — Ambulatory Visit
Admission: RE | Admit: 2015-10-29 | Discharge: 2015-10-29 | Disposition: A | Discharge status: Home / Self Care | Payer: Medicare Other | Source: Ambulatory Visit | Attending: Geriatric Medicine | Admitting: Geriatric Medicine

## 2015-10-29 ENCOUNTER — Other Ambulatory Visit: Payer: Self-pay | Admitting: Geriatric Medicine

## 2015-10-29 DIAGNOSIS — R9389 Abnormal findings on diagnostic imaging of other specified body structures: Secondary | ICD-10-CM

## 2015-10-30 ENCOUNTER — Other Ambulatory Visit: Payer: Self-pay | Admitting: Geriatric Medicine

## 2015-10-30 DIAGNOSIS — R9389 Abnormal findings on diagnostic imaging of other specified body structures: Secondary | ICD-10-CM

## 2015-11-05 ENCOUNTER — Encounter (HOSPITAL_COMMUNITY): Payer: Self-pay | Admitting: *Deleted

## 2015-11-05 ENCOUNTER — Inpatient Hospital Stay (HOSPITAL_COMMUNITY)
Admission: EM | Admit: 2015-11-05 | Discharge: 2015-11-08 | DRG: 189 | Disposition: A | Discharge status: Skilled Nursing Facility | Payer: Medicare Other | Attending: Internal Medicine | Admitting: Internal Medicine

## 2015-11-05 ENCOUNTER — Emergency Department (HOSPITAL_COMMUNITY): Payer: Medicare Other

## 2015-11-05 DIAGNOSIS — Z888 Allergy status to other drugs, medicaments and biological substances status: Secondary | ICD-10-CM

## 2015-11-05 DIAGNOSIS — E875 Hyperkalemia: Secondary | ICD-10-CM

## 2015-11-05 DIAGNOSIS — Z7984 Long term (current) use of oral hypoglycemic drugs: Secondary | ICD-10-CM

## 2015-11-05 DIAGNOSIS — E872 Acidosis: Secondary | ICD-10-CM | POA: Diagnosis present

## 2015-11-05 DIAGNOSIS — F419 Anxiety disorder, unspecified: Secondary | ICD-10-CM

## 2015-11-05 DIAGNOSIS — G9341 Metabolic encephalopathy: Secondary | ICD-10-CM | POA: Diagnosis present

## 2015-11-05 DIAGNOSIS — D649 Anemia, unspecified: Secondary | ICD-10-CM | POA: Diagnosis present

## 2015-11-05 DIAGNOSIS — N189 Chronic kidney disease, unspecified: Secondary | ICD-10-CM

## 2015-11-05 DIAGNOSIS — J45909 Unspecified asthma, uncomplicated: Secondary | ICD-10-CM | POA: Diagnosis present

## 2015-11-05 DIAGNOSIS — G9340 Encephalopathy, unspecified: Secondary | ICD-10-CM | POA: Diagnosis not present

## 2015-11-05 DIAGNOSIS — J189 Pneumonia, unspecified organism: Secondary | ICD-10-CM

## 2015-11-05 DIAGNOSIS — J45901 Unspecified asthma with (acute) exacerbation: Secondary | ICD-10-CM

## 2015-11-05 DIAGNOSIS — J90 Pleural effusion, not elsewhere classified: Secondary | ICD-10-CM

## 2015-11-05 DIAGNOSIS — N179 Acute kidney failure, unspecified: Secondary | ICD-10-CM

## 2015-11-05 DIAGNOSIS — J9601 Acute respiratory failure with hypoxia: Secondary | ICD-10-CM | POA: Diagnosis present

## 2015-11-05 DIAGNOSIS — Z7901 Long term (current) use of anticoagulants: Secondary | ICD-10-CM

## 2015-11-05 DIAGNOSIS — I129 Hypertensive chronic kidney disease with stage 1 through stage 4 chronic kidney disease, or unspecified chronic kidney disease: Secondary | ICD-10-CM | POA: Diagnosis present

## 2015-11-05 DIAGNOSIS — G629 Polyneuropathy, unspecified: Secondary | ICD-10-CM | POA: Diagnosis present

## 2015-11-05 DIAGNOSIS — E1122 Type 2 diabetes mellitus with diabetic chronic kidney disease: Secondary | ICD-10-CM | POA: Diagnosis present

## 2015-11-05 DIAGNOSIS — Z66 Do not resuscitate: Secondary | ICD-10-CM | POA: Diagnosis present

## 2015-11-05 DIAGNOSIS — R29898 Other symptoms and signs involving the musculoskeletal system: Secondary | ICD-10-CM

## 2015-11-05 DIAGNOSIS — I4891 Unspecified atrial fibrillation: Secondary | ICD-10-CM | POA: Diagnosis present

## 2015-11-05 DIAGNOSIS — J9600 Acute respiratory failure, unspecified whether with hypoxia or hypercapnia: Secondary | ICD-10-CM | POA: Insufficient documentation

## 2015-11-05 DIAGNOSIS — E1065 Type 1 diabetes mellitus with hyperglycemia: Secondary | ICD-10-CM

## 2015-11-05 DIAGNOSIS — J9602 Acute respiratory failure with hypercapnia: Secondary | ICD-10-CM | POA: Diagnosis present

## 2015-11-05 DIAGNOSIS — R0600 Dyspnea, unspecified: Secondary | ICD-10-CM | POA: Insufficient documentation

## 2015-11-05 DIAGNOSIS — IMO0001 Reserved for inherently not codable concepts without codable children: Secondary | ICD-10-CM | POA: Diagnosis present

## 2015-11-05 DIAGNOSIS — W19XXXA Unspecified fall, initial encounter: Secondary | ICD-10-CM | POA: Diagnosis present

## 2015-11-05 DIAGNOSIS — S0012XA Contusion of left eyelid and periocular area, initial encounter: Secondary | ICD-10-CM | POA: Diagnosis present

## 2015-11-05 DIAGNOSIS — R0603 Acute respiratory distress: Secondary | ICD-10-CM

## 2015-11-05 DIAGNOSIS — N182 Chronic kidney disease, stage 2 (mild): Secondary | ICD-10-CM | POA: Diagnosis present

## 2015-11-05 DIAGNOSIS — E118 Type 2 diabetes mellitus with unspecified complications: Secondary | ICD-10-CM | POA: Diagnosis not present

## 2015-11-05 DIAGNOSIS — J440 Chronic obstructive pulmonary disease with acute lower respiratory infection: Secondary | ICD-10-CM | POA: Diagnosis present

## 2015-11-05 DIAGNOSIS — E1165 Type 2 diabetes mellitus with hyperglycemia: Secondary | ICD-10-CM | POA: Diagnosis present

## 2015-11-05 DIAGNOSIS — Z794 Long term (current) use of insulin: Secondary | ICD-10-CM | POA: Diagnosis not present

## 2015-11-05 DIAGNOSIS — N390 Urinary tract infection, site not specified: Secondary | ICD-10-CM

## 2015-11-05 DIAGNOSIS — R531 Weakness: Secondary | ICD-10-CM

## 2015-11-05 DIAGNOSIS — I10 Essential (primary) hypertension: Secondary | ICD-10-CM

## 2015-11-05 DIAGNOSIS — I509 Heart failure, unspecified: Secondary | ICD-10-CM

## 2015-11-05 DIAGNOSIS — IMO0002 Reserved for concepts with insufficient information to code with codable children: Secondary | ICD-10-CM

## 2015-11-05 DIAGNOSIS — J449 Chronic obstructive pulmonary disease, unspecified: Secondary | ICD-10-CM | POA: Insufficient documentation

## 2015-11-05 DIAGNOSIS — E108 Type 1 diabetes mellitus with unspecified complications: Secondary | ICD-10-CM

## 2015-11-05 HISTORY — DX: Unspecified fall, initial encounter: W19.XXXA

## 2015-11-05 HISTORY — DX: Chronic obstructive pulmonary disease, unspecified: J44.9

## 2015-11-05 HISTORY — DX: Other complications of anesthesia, initial encounter: T88.59XA

## 2015-11-05 HISTORY — DX: Urinary tract infection, site not specified: N39.0

## 2015-11-05 HISTORY — DX: Nausea with vomiting, unspecified: R11.2

## 2015-11-05 HISTORY — DX: Other specified postprocedural states: Z98.890

## 2015-11-05 HISTORY — DX: Adverse effect of unspecified anesthetic, initial encounter: T41.45XA

## 2015-11-05 HISTORY — DX: Reserved for inherently not codable concepts without codable children: IMO0001

## 2015-11-05 LAB — BLOOD GAS, ARTERIAL
ACID-BASE EXCESS: 10.6 mmol/L — AB (ref 0.0–2.0)
Acid-Base Excess: 8.7 mmol/L — ABNORMAL HIGH (ref 0.0–2.0)
BICARBONATE: 35.1 meq/L — AB (ref 20.0–24.0)
BICARBONATE: 36.6 meq/L — AB (ref 20.0–24.0)
DRAWN BY: 280981
DRAWN BY: 41308
O2 CONTENT: 0.3 L/min
O2 Content: 4 L/min
O2 SAT: 93.7 %
O2 Saturation: 98 %
PATIENT TEMPERATURE: 98.6
PH ART: 7.298 — AB (ref 7.350–7.450)
PO2 ART: 67.3 mmHg — AB (ref 80.0–100.0)
Patient temperature: 98.6
TCO2: 37.3 mmol/L (ref 0–100)
TCO2: 38.7 mmol/L (ref 0–100)
pCO2 arterial: 73.9 mmHg (ref 35.0–45.0)
pCO2 arterial: 69.8 mmHg (ref 35.0–45.0)
pH, Arterial: 7.339 — ABNORMAL LOW (ref 7.350–7.450)
pO2, Arterial: 105 mmHg — ABNORMAL HIGH (ref 80.0–100.0)

## 2015-11-05 LAB — COMPREHENSIVE METABOLIC PANEL
ALBUMIN: 3 g/dL — AB (ref 3.5–5.0)
ALK PHOS: 90 U/L (ref 38–126)
ALT: 18 U/L (ref 14–54)
ANION GAP: 10 (ref 5–15)
AST: 17 U/L (ref 15–41)
BILIRUBIN TOTAL: 0.5 mg/dL (ref 0.3–1.2)
BUN: 34 mg/dL — ABNORMAL HIGH (ref 6–20)
CALCIUM: 9.3 mg/dL (ref 8.9–10.3)
CO2: 34 mmol/L — ABNORMAL HIGH (ref 22–32)
Chloride: 94 mmol/L — ABNORMAL LOW (ref 101–111)
Creatinine, Ser: 1.19 mg/dL — ABNORMAL HIGH (ref 0.44–1.00)
GFR, EST AFRICAN AMERICAN: 51 mL/min — AB (ref >60)
GFR, EST NON AFRICAN AMERICAN: 44 mL/min — AB (ref >60)
GLUCOSE: 322 mg/dL — AB (ref 65–99)
POTASSIUM: 6 mmol/L — AB (ref 3.5–5.1)
Sodium: 138 mmol/L (ref 135–145)
TOTAL PROTEIN: 7.3 g/dL (ref 6.5–8.1)

## 2015-11-05 LAB — IRON AND TIBC
Iron: 25 ug/dL — ABNORMAL LOW (ref 28–170)
Saturation Ratios: 10 % — ABNORMAL LOW (ref 10.4–31.8)
TIBC: 239 ug/dL — ABNORMAL LOW (ref 250–450)
UIBC: 214 ug/dL

## 2015-11-05 LAB — RETICULOCYTES
RBC.: 3.83 MIL/uL — ABNORMAL LOW (ref 3.87–5.11)
RETIC COUNT ABSOLUTE: 84.3 10*3/uL (ref 19.0–186.0)
Retic Ct Pct: 2.2 % (ref 0.4–3.1)

## 2015-11-05 LAB — CBC WITH DIFFERENTIAL/PLATELET
BASOS ABS: 0 10*3/uL (ref 0.0–0.1)
BASOS PCT: 0 %
Eosinophils Absolute: 0 10*3/uL (ref 0.0–0.7)
Eosinophils Relative: 0 %
HEMATOCRIT: 35.2 % — AB (ref 36.0–46.0)
HEMOGLOBIN: 9.9 g/dL — AB (ref 12.0–15.0)
Lymphocytes Relative: 7 %
Lymphs Abs: 0.5 10*3/uL — ABNORMAL LOW (ref 0.7–4.0)
MCH: 24.6 pg — ABNORMAL LOW (ref 26.0–34.0)
MCHC: 28.1 g/dL — ABNORMAL LOW (ref 30.0–36.0)
MCV: 87.3 fL (ref 78.0–100.0)
Monocytes Absolute: 0.4 10*3/uL (ref 0.1–1.0)
Monocytes Relative: 5 %
NEUTROS ABS: 6.9 10*3/uL (ref 1.7–7.7)
NEUTROS PCT: 88 %
Platelets: 382 10*3/uL (ref 150–400)
RBC: 4.03 MIL/uL (ref 3.87–5.11)
RDW: 15.4 % (ref 11.5–15.5)
WBC: 7.9 10*3/uL (ref 4.0–10.5)

## 2015-11-05 LAB — URINALYSIS, ROUTINE W REFLEX MICROSCOPIC
Bilirubin Urine: NEGATIVE
Glucose, UA: 100 mg/dL — AB
Hgb urine dipstick: NEGATIVE
KETONES UR: NEGATIVE mg/dL
LEUKOCYTES UA: NEGATIVE
NITRITE: NEGATIVE
PH: 5 (ref 5.0–8.0)
Protein, ur: 30 mg/dL — AB
SPECIFIC GRAVITY, URINE: 1.018 (ref 1.005–1.030)

## 2015-11-05 LAB — BRAIN NATRIURETIC PEPTIDE: B NATRIURETIC PEPTIDE 5: 185.1 pg/mL — AB (ref 0.0–100.0)

## 2015-11-05 LAB — FOLATE: Folate: 15 ng/mL (ref >5.9)

## 2015-11-05 LAB — GLUCOSE, CAPILLARY
GLUCOSE-CAPILLARY: 260 mg/dL — AB (ref 65–99)
Glucose-Capillary: 258 mg/dL — ABNORMAL HIGH (ref 65–99)
Glucose-Capillary: 190 mg/dL — ABNORMAL HIGH (ref 65–99)

## 2015-11-05 LAB — TROPONIN I: Troponin I: <0.03 ng/mL (ref <0.031)

## 2015-11-05 LAB — INFLUENZA PANEL BY PCR (TYPE A & B)
H1N1 flu by pcr: NOT DETECTED
INFLAPCR: NEGATIVE
Influenza B By PCR: NEGATIVE

## 2015-11-05 LAB — FERRITIN: FERRITIN: 122 ng/mL (ref 11–307)

## 2015-11-05 LAB — VITAMIN B12: Vitamin B-12: 608 pg/mL (ref 180–914)

## 2015-11-05 LAB — URINE MICROSCOPIC-ADD ON
BACTERIA UA: NONE SEEN
WBC UA: NONE SEEN WBC/hpf (ref 0–5)

## 2015-11-05 LAB — TSH: TSH: 2.015 u[IU]/mL (ref 0.350–4.500)

## 2015-11-05 MED ORDER — METHYLPREDNISOLONE SODIUM SUCC 125 MG IJ SOLR
125.0000 mg | Freq: Once | INTRAMUSCULAR | Status: AC
  Administered 2015-11-05: 125 mg via INTRAVENOUS
  Filled 2015-11-05: qty 2

## 2015-11-05 MED ORDER — ONDANSETRON HCL 4 MG/2ML IJ SOLN
4.0000 mg | Freq: Four times a day (QID) | INTRAMUSCULAR | Status: DC | PRN
  Administered 2015-11-05: 4 mg via INTRAVENOUS
  Filled 2015-11-05: qty 2

## 2015-11-05 MED ORDER — INSULIN GLARGINE 100 UNIT/ML ~~LOC~~ SOLN
12.0000 [IU] | Freq: Every day | SUBCUTANEOUS | Status: DC
  Administered 2015-11-05: 12 [IU] via SUBCUTANEOUS
  Filled 2015-11-05 (×2): qty 0.12

## 2015-11-05 MED ORDER — METHYLPREDNISOLONE SODIUM SUCC 125 MG IJ SOLR
60.0000 mg | Freq: Four times a day (QID) | INTRAMUSCULAR | Status: DC
  Administered 2015-11-05 – 2015-11-07 (×6): 60 mg via INTRAVENOUS
  Filled 2015-11-05 (×6): qty 2

## 2015-11-05 MED ORDER — ACETAMINOPHEN 325 MG PO TABS
650.0000 mg | ORAL_TABLET | Freq: Four times a day (QID) | ORAL | Status: DC | PRN
  Administered 2015-11-07 – 2015-11-08 (×2): 650 mg via ORAL
  Filled 2015-11-05 (×2): qty 2

## 2015-11-05 MED ORDER — ALBUTEROL SULFATE (2.5 MG/3ML) 0.083% IN NEBU
2.5000 mg | INHALATION_SOLUTION | RESPIRATORY_TRACT | Status: DC
  Administered 2015-11-05 – 2015-11-06 (×4): 2.5 mg via RESPIRATORY_TRACT
  Filled 2015-11-05 (×5): qty 3

## 2015-11-05 MED ORDER — SODIUM POLYSTYRENE SULFONATE 15 GM/60ML PO SUSP: 30.0000 g | Freq: Once | ORAL | Status: DC

## 2015-11-05 MED ORDER — INSULIN ASPART 100 UNIT/ML ~~LOC~~ SOLN
15.0000 [IU] | Freq: Once | SUBCUTANEOUS | Status: AC
  Administered 2015-11-05: 15 [IU] via SUBCUTANEOUS

## 2015-11-05 MED ORDER — ALBUTEROL SULFATE (2.5 MG/3ML) 0.083% IN NEBU: 2.5000 mg | INHALATION_SOLUTION | RESPIRATORY_TRACT | Status: DC | PRN

## 2015-11-05 MED ORDER — FUROSEMIDE 10 MG/ML IJ SOLN: 20.0000 mg | Freq: Once | INTRAMUSCULAR | Status: DC

## 2015-11-05 MED ORDER — SODIUM CHLORIDE 0.9 % IV SOLN
INTRAVENOUS | Status: AC
  Administered 2015-11-05: 19:00:00 via INTRAVENOUS

## 2015-11-05 MED ORDER — ACETAMINOPHEN 650 MG RE SUPP: 650.0000 mg | Freq: Four times a day (QID) | RECTAL | Status: DC | PRN

## 2015-11-05 MED ORDER — INSULIN ASPART 100 UNIT/ML ~~LOC~~ SOLN
0.0000 [IU] | Freq: Three times a day (TID) | SUBCUTANEOUS | Status: DC
  Administered 2015-11-05: 8 [IU] via SUBCUTANEOUS
  Administered 2015-11-06: 11 [IU] via SUBCUTANEOUS
  Administered 2015-11-06: 8 [IU] via SUBCUTANEOUS
  Administered 2015-11-06: 11 [IU] via SUBCUTANEOUS
  Administered 2015-11-07: 5 [IU] via SUBCUTANEOUS
  Administered 2015-11-07 (×2): 15 [IU] via SUBCUTANEOUS
  Administered 2015-11-08 (×2): 8 [IU] via SUBCUTANEOUS

## 2015-11-05 MED ORDER — INSULIN ASPART 100 UNIT/ML ~~LOC~~ SOLN: 15.0000 [IU] | Freq: Once | SUBCUTANEOUS | Status: DC

## 2015-11-05 MED ORDER — GABAPENTIN 400 MG PO CAPS
400.0000 mg | ORAL_CAPSULE | Freq: Three times a day (TID) | ORAL | Status: DC
  Administered 2015-11-05 – 2015-11-08 (×8): 400 mg via ORAL
  Filled 2015-11-05 (×8): qty 1

## 2015-11-05 MED ORDER — LORATADINE 10 MG PO TABS: 10.0000 mg | ORAL_TABLET | Freq: Every day | ORAL | Status: DC

## 2015-11-05 MED ORDER — LEVOFLOXACIN IN D5W 750 MG/150ML IV SOLN
750.0000 mg | INTRAVENOUS | Status: DC
  Administered 2015-11-05 – 2015-11-07 (×2): 750 mg via INTRAVENOUS
  Filled 2015-11-05 (×3): qty 150

## 2015-11-05 MED ORDER — LORATADINE 10 MG PO TABS
10.0000 mg | ORAL_TABLET | Freq: Every day | ORAL | Status: DC
  Administered 2015-11-06 – 2015-11-08 (×3): 10 mg via ORAL
  Filled 2015-11-05 (×3): qty 1

## 2015-11-05 MED ORDER — METHYLPREDNISOLONE SODIUM SUCC 125 MG IJ SOLR: 125.0000 mg | Freq: Once | INTRAMUSCULAR | Status: DC

## 2015-11-05 MED ORDER — IPRATROPIUM-ALBUTEROL 0.5-2.5 (3) MG/3ML IN SOLN
3.0000 mL | Freq: Once | RESPIRATORY_TRACT | Status: AC
  Administered 2015-11-05: 3 mL via RESPIRATORY_TRACT
  Filled 2015-11-05: qty 3

## 2015-11-05 MED ORDER — SODIUM POLYSTYRENE SULFONATE 15 GM/60ML PO SUSP
30.0000 g | Freq: Once | ORAL | Status: AC
  Administered 2015-11-05: 30 g via ORAL
  Filled 2015-11-05 (×2): qty 120

## 2015-11-05 MED ORDER — ALPRAZOLAM 0.25 MG PO TABS
0.2500 mg | ORAL_TABLET | Freq: Two times a day (BID) | ORAL | Status: DC | PRN
  Administered 2015-11-05 – 2015-11-06 (×2): 0.25 mg via ORAL
  Filled 2015-11-05 (×2): qty 1

## 2015-11-05 MED ORDER — DILTIAZEM HCL ER COATED BEADS 120 MG PO CP24
120.0000 mg | ORAL_CAPSULE | Freq: Every day | ORAL | Status: DC
  Administered 2015-11-06 – 2015-11-08 (×3): 120 mg via ORAL
  Filled 2015-11-05 (×3): qty 1

## 2015-11-05 MED ORDER — SODIUM CHLORIDE 0.9% FLUSH
3.0000 mL | Freq: Two times a day (BID) | INTRAVENOUS | Status: DC
Start: 2015-11-05 — End: 2015-11-08
  Administered 2015-11-06 – 2015-11-08 (×2): 3 mL via INTRAVENOUS

## 2015-11-05 MED ORDER — SODIUM CHLORIDE 0.9% FLUSH
3.0000 mL | Freq: Two times a day (BID) | INTRAVENOUS | Status: DC
  Administered 2015-11-06 – 2015-11-08 (×5): 3 mL via INTRAVENOUS

## 2015-11-05 MED ORDER — ATORVASTATIN CALCIUM 10 MG PO TABS: 10.0000 mg | ORAL_TABLET | Freq: Every day | ORAL | Status: DC

## 2015-11-05 MED ORDER — ATORVASTATIN CALCIUM 10 MG PO TABS
10.0000 mg | ORAL_TABLET | Freq: Every day | ORAL | Status: DC
  Administered 2015-11-06 – 2015-11-08 (×3): 10 mg via ORAL
  Filled 2015-11-05 (×3): qty 1

## 2015-11-05 MED ORDER — METHYLPREDNISOLONE SODIUM SUCC 125 MG IJ SOLR: 60.0000 mg | Freq: Four times a day (QID) | INTRAMUSCULAR | Status: DC

## 2015-11-05 MED ORDER — INSULIN ASPART 100 UNIT/ML ~~LOC~~ SOLN
0.0000 [IU] | Freq: Every day | SUBCUTANEOUS | Status: DC
  Administered 2015-11-06: 3 [IU] via SUBCUTANEOUS
  Administered 2015-11-07: 5 [IU] via SUBCUTANEOUS

## 2015-11-05 MED ORDER — SODIUM CHLORIDE 0.9% FLUSH: 3.0000 mL | INTRAVENOUS | Status: DC | PRN

## 2015-11-05 MED ORDER — APIXABAN 5 MG PO TABS
5.0000 mg | ORAL_TABLET | Freq: Two times a day (BID) | ORAL | Status: DC
  Administered 2015-11-05 – 2015-11-08 (×6): 5 mg via ORAL
  Filled 2015-11-05 (×6): qty 1

## 2015-11-05 MED ORDER — ALBUTEROL SULFATE HFA 108 (90 BASE) MCG/ACT IN AERS: 2.0000 | INHALATION_SPRAY | RESPIRATORY_TRACT | Status: DC | PRN

## 2015-11-05 MED ORDER — ONDANSETRON HCL 4 MG PO TABS
4.0000 mg | ORAL_TABLET | Freq: Four times a day (QID) | ORAL | Status: DC | PRN
  Filled 2015-11-05: qty 1

## 2015-11-05 MED ORDER — DULOXETINE HCL 60 MG PO CPEP
60.0000 mg | ORAL_CAPSULE | Freq: Every day | ORAL | Status: DC
  Administered 2015-11-06 – 2015-11-08 (×3): 60 mg via ORAL
  Filled 2015-11-05 (×3): qty 1

## 2015-11-05 MED ORDER — SODIUM CHLORIDE 0.9 % IV SOLN: 250.0000 mL | INTRAVENOUS | Status: DC | PRN

## 2015-11-05 NOTE — Progress Notes (Signed)
1718  abg result to dr Evelena Peat with order pt to transfer to step down unit

## 2015-11-05 NOTE — Progress Notes (Signed)
Attempted to place patient on BIPAP per order, pt states she is nauseous at this time. Pt in no distress, mentation normal, and ABG improved since previous. RN aware. Will continue to monitor.

## 2015-11-05 NOTE — Progress Notes (Signed)
Pt arrived to room 2c04 at this time from 3east.  No s/s of any acute distress at this time.  No c/o pain.  Call bell in reach.

## 2015-11-05 NOTE — Progress Notes (Signed)
ANTIBIOTIC CONSULT NOTE - INITIAL  Pharmacy Consult for Levofloxacin Indication: COPD exacerbation  Allergies  Allergen Reactions  . Etodolac Anaphylaxis  . Xarelto [Rivaroxaban] Itching    Patient Measurements: Height:  (154.9 cm) Weight: 183 lb (83.008 kg) IBW/kg (Calculated) : 47.8  Vital Signs: Temp: 97.8 F (36.6 C) (01/31 1549) Temp Source: Oral (01/31 1549) BP: 135/64 mmHg (01/31 1549) Pulse Rate: 103 (01/31 1549) Intake/Output from previous day:   Intake/Output from this shift:    Labs:  Recent Labs  11/05/15 1113  WBC 7.9  HGB 9.9*  PLT 382  CREATININE 1.19*   Estimated Creatinine Clearance: 41.1 mL/min (by C-G formula based on Cr of 1.19). No results for input(s): VANCOTROUGH, VANCOPEAK, VANCORANDOM, GENTTROUGH, GENTPEAK, GENTRANDOM, TOBRATROUGH, TOBRAPEAK, TOBRARND, AMIKACINPEAK, AMIKACINTROU, AMIKACIN in the last 72 hours.   Microbiology: No results found for this or any previous visit (from the past 720 hour(s)).  Assessment: 43 YOF here with acute encephalopathy, ABG showing respiratry acidosis with hypercarbia. To start levofloxacin for possible COPD exacerbation, but admitting MD notes to discontinue quickly if no benefit seen.  CrCl ~40-58mL/min. Rest of labs as above  Goal of Therapy:  proper dosing based on renal and hepatic function  Plan:  -Levofloxacin  IV q48h -Follow renal function, clinical progression, thoughts about discontinuing based on improvement  Jarell Mcewen D. Elanie Hammitt, PharmD, BCPS Clinical Pharmacist Pager: [phone] 11/05/2015 5:30 PM

## 2015-11-05 NOTE — Progress Notes (Signed)
Pt states she is no longer feeling nauseous, therefore she is placed on BIPAP at this time. Tolerating well and resting comfortably

## 2015-11-05 NOTE — ED Provider Notes (Signed)
CSN: [id number]     Arrival date & time 11/05/15  1025 History   First MD Initiated Contact with Patient 11/05/15 1032     Chief Complaint  Patient presents with  . Shortness of Breath     most history obtained from family members. Patient appears somewhat confused.  Patient is a 75 y.o. female presenting with shortness of breath. The history is provided by the patient.  Shortness of Breath  patient presents with shortness of breath and altered mental status. Recent admission for UTI and pneumonia. Has been home for around a month and has been doing worse over the last 2 weeks. His been having difficulty getting out of bed. She's been more confused. She had a fall yesterday where she hit her head. She is on Eliquis now and had been on Xarelto. She been off the Xarelto for about 2 weeks that she was itchy. Has been complaining more shortness of breath. Recent admission for pneumonia and UTI and she is acting the same. Unknown fevers or chills. She has more swelling in her legs are normal. Family states patient been confused since before the fall.  Past Medical History  Diagnosis Date  . Diabetes mellitus without complication (HCC)   . Hypertension   . COPD (chronic obstructive pulmonary disease) [facility])    Past Surgical History  Procedure Laterality Date  . Abdominal hysterectomy     History reviewed. No pertinent family history. Social History  Substance Use Topics  . Smoking status: Never Smoker   . Smokeless tobacco: None  . Alcohol Use: No   OB History    No data available     Review of Systems  Unable to perform ROS: Mental status change  Respiratory: Positive for shortness of breath.       Allergies  Etodolac and Xarelto  Home Medications   Prior to Admission medications   Medication Sig Start Date End Date Taking? Authorizing Provider  ALPRAZolam (XANAX) 0.25 MG tablet Take 0.25 mg by mouth 2 (two) times daily as needed for anxiety or sleep.   Yes Historical  Provider, MD  apixaban (ELIQUIS) 5 MG TABS tablet Take 5 mg by mouth 2 (two) times daily.   Yes Historical Provider, MD  atorvastatin (LIPITOR) 10 MG tablet Take 10 mg by mouth daily.   Yes Historical Provider, MD  cetirizine (ZYRTEC) 10 MG tablet Take 10 mg by mouth daily as needed for allergies.   Yes Historical Provider, MD  diltiazem (CARDIZEM CD) 120 MG 24 hr capsule Take 1 capsule (120 mg total) by mouth daily. 10/05/15  Yes Jeralyn Bennett, MD  DULoxetine (CYMBALTA) 60 MG capsule Take 60 mg by mouth daily. 09/18/15  Yes Historical Provider, MD  gabapentin (NEURONTIN) 400 MG capsule Take 400 mg by mouth 3 (three) times daily as needed. 07/27/15  Yes Historical Provider, MD  Insulin Glargine (TOUJEO SOLOSTAR [place]) Inject 36 Units into the skin daily.    Yes Historical Provider, MD  insulin lispro (HUMALOG) 100 UNIT/ML injection Inject 7-12 Units into the skin 3 (three) times daily before meals.   Yes Historical Provider, MD  metFORMIN (GLUCOPHAGE) 500 MG tablet Take 1,000 mg by mouth 2 (two) times daily. 09/19/15  Yes Historical Provider, MD  PROAIR HFA 108 (90 BASE) MCG/ACT inhaler Inhale 2 puffs into the lungs every 4 (four) hours as needed for wheezing.  09/03/15  Yes Historical Provider, MD  predniSONE (STERAPRED UNI-PAK 21 TAB) 10 MG (21) TBPK tablet Take 6-5-4-3-2-1 tablets by mouth daily  till gone. Patient not taking: Reported on 11/05/2015 10/05/15   Jeralyn Bennett, MD  Rivaroxaban (XARELTO) 15 MG TABS tablet Take 1 tablet (15 mg total) by mouth daily with supper. Patient not taking: Reported on 11/05/2015 10/05/15   Jeralyn Bennett, MD   BP 122/89 mmHg  Pulse 88  Temp(Src) 97.8 F (36.6 C) (Oral)  Resp 20  Ht  (1.549 m)  Wt 190 lb (86.183 kg)  BMI 35.92 kg/m2  SpO2 95% Physical Exam  Constitutional: She appears well-developed.  HENT:   Hematoma to left forehead and left periorbital area. External ocular movements intact. No proptosis  Eyes: EOM are normal.   Pupils are  somewhat constricted  Neck: Normal range of motion. Neck supple.  Cardiovascular: Normal rate.   Pulmonary/Chest:   Overall quiet chest. Some mild wheezes. No focal rales.  Abdominal: There is no tenderness.  Musculoskeletal: Normal range of motion.  Neurological: She is alert.   Awake but somewhat slow to answer. Moves all extremities.  Skin: Skin is warm.    ED Course  Procedures (including critical care time) Labs Review Labs Reviewed  COMPREHENSIVE METABOLIC PANEL - Abnormal; Notable for the following:    Potassium 6.0 (*)    Chloride 94 (*)    CO2 34 (*)    Glucose, Bld 322 (*)    BUN 34 (*)    Creatinine, Ser 1.19 (*)    Albumin 3.0 (*)    GFR calc non Af Amer 44 (*)    GFR calc Af Amer 51 (*)    All other components within normal limits  CBC WITH DIFFERENTIAL/PLATELET - Abnormal; Notable for the following:    Hemoglobin 9.9 (*)    HCT 35.2 (*)    MCH 24.6 (*)    MCHC 28.1 (*)    Lymphs Abs 0.5 (*)    All other components within normal limits  URINALYSIS, ROUTINE W REFLEX MICROSCOPIC (NOT AT [facility]) - Abnormal; Notable for the following:    Glucose, UA 100 (*)    Protein, ur 30 (*)    All other components within normal limits  BRAIN NATRIURETIC PEPTIDE - Abnormal; Notable for the following:    B Natriuretic Peptide 185.1 (*)    All other components within normal limits  URINE MICROSCOPIC-ADD ON - Abnormal; Notable for the following:    Squamous Epithelial / LPF 0-5 (*)    Casts HYALINE CASTS (*)    All other components within normal limits  TROPONIN I  TSH  VITAMIN B12  FOLATE  IRON AND TIBC  FERRITIN  RETICULOCYTES  OCCULT BLOOD X 1 CARD TO LAB, STOOL  POC OCCULT BLOOD, ED    Imaging Review Ct Head Wo Contrast  11/05/2015  CLINICAL DATA:  Altered mental status. Lost balance and fell on Sunday. Headache. Swelling and bruising to left frontal region. EXAM: CT HEAD WITHOUT CONTRAST CT MAXILLOFACIAL WITHOUT CONTRAST TECHNIQUE: Multidetector CT imaging of the  head and maxillofacial structures were performed using the standard protocol without intravenous contrast. Multiplanar CT image reconstructions of the maxillofacial structures were also generated. COMPARISON:  09/28/2015 FINDINGS: CT HEAD FINDINGS No acute intracranial abnormality. Specifically, no hemorrhage, hydrocephalus, mass lesion, acute infarction, or significant intracranial injury. No acute calvarial abnormality. CT MAXILLOFACIAL FINDINGS Small air-fluid levels in the maxillary sinuses bilaterally. Soft tissue swelling over the left orbit. No evidence of orbital fracture. Zygomatic arches and mandible are intact. Orbital soft tissues are unremarkable. IMPRESSION: Soft tissue swelling over the left orbit. No evidence of  orbital or facial fracture. Small air-fluid levels in the maxillary sinuses bilaterally felt to be related to acute sinusitis. No acute intracranial abnormality. Electronically Signed   By: Charlett Nose M.D.   On: 11/05/2015 11:56   Dg Chest Portable 1 View  11/05/2015  CLINICAL DATA:  Altered mental status, bruising to LEFT eye, fall, diabetes mellitus, hypertension, COPD EXAM: PORTABLE CHEST 1 VIEW COMPARISON:  10/24/2015; correlation CT chest 10/29/2015 FINDINGS: Borderline enlargement of cardiac silhouette. Atherosclerotic calcification aorta. Minimal pulmonary vascular congestion. New interstitial infiltrates bilaterally asymmetrically greater on RIGHT question asymmetric edema. Associated RIGHT pleural effusion. No pneumothorax. Bones demineralized without definite fracture. IMPRESSION: Question mild asymmetric pulmonary edema and RIGHT pleural effusion. Electronically Signed   By: Ulyses Southward M.D.   On: 11/05/2015 11:06   Ct Maxillofacial Wo Cm  11/05/2015  CLINICAL DATA:  Altered mental status. Lost balance and fell on Sunday. Headache. Swelling and bruising to left frontal region. EXAM: CT HEAD WITHOUT CONTRAST CT MAXILLOFACIAL WITHOUT CONTRAST TECHNIQUE: Multidetector CT  imaging of the head and maxillofacial structures were performed using the standard protocol without intravenous contrast. Multiplanar CT image reconstructions of the maxillofacial structures were also generated. COMPARISON:  09/28/2015 FINDINGS: CT HEAD FINDINGS No acute intracranial abnormality. Specifically, no hemorrhage, hydrocephalus, mass lesion, acute infarction, or significant intracranial injury. No acute calvarial abnormality. CT MAXILLOFACIAL FINDINGS Small air-fluid levels in the maxillary sinuses bilaterally. Soft tissue swelling over the left orbit. No evidence of orbital fracture. Zygomatic arches and mandible are intact. Orbital soft tissues are unremarkable. IMPRESSION: Soft tissue swelling over the left orbit. No evidence of orbital or facial fracture. Small air-fluid levels in the maxillary sinuses bilaterally felt to be related to acute sinusitis. No acute intracranial abnormality. Electronically Signed   By: Charlett Nose M.D.   On: 11/05/2015 11:56   I have personally reviewed and evaluated these images and lab results as part of my medical decision-making.   EKG Interpretation   Date/Time:  Tuesday November 05 2015 10:44:28 EST Ventricular Rate:  99 PR Interval:    QRS Duration: 86 QT Interval:  327 QTC Calculation: 420 R Axis:   75 Text Interpretation:  Atrial fibrillation Ventricular premature complex  Low voltage, precordial leads Anteroseptal infarct, old Confirmed by  Ascencion Coye  MD, Sandrine Bloodsworth [phone]) on 11/05/2015 10:47:00 AM      MDM   Final diagnoses:  Dyspnea  Anemia, unspecified anemia type  Congestive heart failure, unspecified congestive heart failure chronicity, unspecified congestive heart failure type (HCC)     patient present with worsening shortness of breath. Also has had some confusion. X-ray shows possible mild CHF. BNP is mildly elevated. Also has an increase in anemia.  A she denies active bleeding. She is on anticoagulation. Also fell in her head but  is negative CTs. Will admit to internal medicine.    Benjiman Core, MD 11/05/15 [phone]

## 2015-11-05 NOTE — Progress Notes (Signed)
CRITICAL VALUE ALERT  Critical value received:  abg ph - 7.33  PCO2 - 69.9 PaO2 - 67.3 Bicarb - 36.6   Date of notification:  11/05/15  Time of notification:  1935  Critical value read back: Yes  Nurse who received alert:  Windell Moulding, RN  MD notified (1st page):  [place]orr  Time of first page:  1940  Orders in place for interventions

## 2015-11-05 NOTE — ED Notes (Signed)
Pt states she's been experiencing increased sob. Pt is always on 2L [place] at home. Upon arrival pt is at 100% on 2L [place] and appears to be in NAD. Pt has a hematoma noted to her left orbit from a fall on Monday, pt is on blood thinners, denies LOC.

## 2015-11-05 NOTE — H&P (Signed)
[facility] History and Physical  KARCYN MENN UJW:[id number] DOB: 11/19/1940 DOA: 11/05/2015  Referring physician: Rubin Payor PCP: Ginette Otto, MD   Chief Complaint: sob/acute encephalopathy/fall  HPI: [name] is a 75 y.o. female with past medical history includes A. fib, asthma, recent pneumonia, diabetes, hypertension presents to the emergency department from home with the chief complaint of worsening shortness of breath and gradual onset confusion. Initial evaluation in the emergency department reveals acute respiratory distress likely related to asthma exacerbation in the setting of all pulmonary edema/pleural effusion, hyperkalemia, anemia hyperglycemia.  Information is obtained from the patient's daughter who is at the bedside. Patient discharged 1 month ago after being hospitalized for acute encephalopathy related to community-acquired pneumonia, UTI, located by A. fib with RVR. Has been doing well up until 2 days ago. She did get home on oxygen. Daughter reports gradual worsening of shortness of breath associated with increased nonproductive cough. Associated symptoms include generalized weakness and intermittent mild confusion. Yesterday she got up to the bathroom and fell. No reports of chest pain complaints of palpitation headache dizziness syncope or near-syncope. No complaints of nausea vomiting diarrhea. Family reports her appetite has been only fair has maintained hydration. No lower extremity edema no orthopnea  In the emergency department she's afebrile hemodynamically stable and not hypoxic.   Review of Systems:  10 point review of systems complete and all systems are negative except as indicated in the history of present illness  Past Medical History  Diagnosis Date  . Diabetes mellitus without complication (HCC)   . Hypertension   . COPD (chronic obstructive pulmonary disease) [facility])    Past Surgical History  Procedure Laterality Date  . Abdominal  hysterectomy     Social History:  reports that she has never smoked. She does not have any smokeless tobacco history on file. She reports that she does not drink alcohol or use illicit drugs.  Allergies  Allergen Reactions  . Etodolac Anaphylaxis  . Xarelto [Rivaroxaban] Itching    History reviewed. No pertinent family history. family medical history reviewed and is negative for cancer stroke diabetes  Prior to Admission medications   Medication Sig Start Date End Date Taking? Authorizing Provider  ALPRAZolam (XANAX) 0.25 MG tablet Take 0.25 mg by mouth 2 (two) times daily as needed for anxiety or sleep.   Yes Historical Provider, MD  apixaban (ELIQUIS) 5 MG TABS tablet Take 5 mg by mouth 2 (two) times daily.   Yes Historical Provider, MD  atorvastatin (LIPITOR) 10 MG tablet Take 10 mg by mouth daily.   Yes Historical Provider, MD  cetirizine (ZYRTEC) 10 MG tablet Take 10 mg by mouth daily as needed for allergies.   Yes Historical Provider, MD  diltiazem (CARDIZEM CD) 120 MG 24 hr capsule Take 1 capsule (120 mg total) by mouth daily. 10/05/15  Yes Jeralyn Bennett, MD  DULoxetine (CYMBALTA) 60 MG capsule Take 60 mg by mouth daily. 09/18/15  Yes Historical Provider, MD  gabapentin (NEURONTIN) 400 MG capsule Take 400 mg by mouth 3 (three) times daily as needed. 07/27/15  Yes Historical Provider, MD  Insulin Glargine (TOUJEO SOLOSTAR [place]) Inject 36 Units into the skin daily.    Yes Historical Provider, MD  insulin lispro (HUMALOG) 100 UNIT/ML injection Inject 7-12 Units into the skin 3 (three) times daily before meals.   Yes Historical Provider, MD  metFORMIN (GLUCOPHAGE) 500 MG tablet Take 1,000 mg by mouth 2 (two) times daily. 09/19/15  Yes Historical Provider, MD  Rennis Golden  HFA 108 (90 BASE) MCG/ACT inhaler Inhale 2 puffs into the lungs every 4 (four) hours as needed for wheezing.  09/03/15  Yes Historical Provider, MD  predniSONE (STERAPRED UNI-PAK 21 TAB) 10 MG (21) TBPK tablet Take 6-5-4-3-2-1  tablets by mouth daily till gone. Patient not taking: Reported on 11/05/2015 10/05/15   Jeralyn Bennett, MD  Rivaroxaban (XARELTO) 15 MG TABS tablet Take 1 tablet (15 mg total) by mouth daily with supper. Patient not taking: Reported on 11/05/2015 10/05/15   Jeralyn Bennett, MD   Physical Exam: Filed Vitals:   11/05/15 1040 11/05/15 1100 11/05/15 1300 11/05/15 1330  BP:  103/60 105/72 122/89  Pulse:  90 90 88  Temp: 97.8 F (36.6 C)     TempSrc: Oral     Resp:  20 20 20   Height: 5\' 1"  (1.549 m)     Weight: 86.183 kg (190 lb)     SpO2:  100% 98% 95%    Wt Readings from Last 3 Encounters:  11/05/15 86.183 kg (190 lb)  10/05/15 86.909 kg (191 lb 9.6 oz)  04/16/10 87.544 kg (193 lb)    General:  Appears slightly anxious but not uncomfortable Eyes: PERRL, hematoma left forehead and periorbital area. EOMI irises & conjunctiva ENT: grossly normal hearing, mucous membranes of her mouth are pink slightly dry Neck: no LAD, masses or thyromegaly Cardiovascular: Irregularly irregular no m/r/g. No LE edema.  Respiratory: Mild increased work of breathing with conversation. Prolonged expiratory phase. Faint end expiratory wheezing fine crackles right base able to complete sentences Abdomen: soft, ntnd, obese positive bowel sounds Skin: Hematoma to the left forehead Musculoskeletal: grossly normal tone BUE/BLE Psychiatric: grossly normal mood and affect, speech fluent and appropriate Neurologic: grossly non-focal. Speech clear facial symmetry oriented 3 able to follow commands. Able to make wants and needs known           Labs on Admission:  Basic Metabolic Panel:  Recent Labs Lab 11/05/15 1113  NA 138  K 6.0*  CL 94*  CO2 34*  GLUCOSE 322*  BUN 34*  CREATININE 1.19*  CALCIUM 9.3   Liver Function Tests:  Recent Labs Lab 11/05/15 1113  AST 17  ALT 18  ALKPHOS 90  BILITOT 0.5  PROT 7.3  ALBUMIN 3.0*   No results for input(s): LIPASE, AMYLASE in the last 168 hours. No  results for input(s): AMMONIA in the last 168 hours. CBC:  Recent Labs Lab 11/05/15 1113  WBC 7.9  NEUTROABS 6.9  HGB 9.9*  HCT 35.2*  MCV 87.3  PLT 382   Cardiac Enzymes:  Recent Labs Lab 11/05/15 1113  TROPONINI <0.03    BNP (last 3 results)  Recent Labs  09/28/15 1400 11/05/15 1113  BNP 68.5 185.1*    ProBNP (last 3 results) No results for input(s): PROBNP in the last 8760 hours.  CBG: No results for input(s): GLUCAP in the last 168 hours.  Radiological Exams on Admission: Ct Head Wo Contrast  11/05/2015  CLINICAL DATA:  Altered mental status. Lost balance and fell on Sunday. Headache. Swelling and bruising to left frontal region. EXAM: CT HEAD WITHOUT CONTRAST CT MAXILLOFACIAL WITHOUT CONTRAST TECHNIQUE: Multidetector CT imaging of the head and maxillofacial structures were performed using the standard protocol without intravenous contrast. Multiplanar CT image reconstructions of the maxillofacial structures were also generated. COMPARISON:  09/28/2015 FINDINGS: CT HEAD FINDINGS No acute intracranial abnormality. Specifically, no hemorrhage, hydrocephalus, mass lesion, acute infarction, or significant intracranial injury. No acute calvarial abnormality. CT MAXILLOFACIAL FINDINGS  Small air-fluid levels in the maxillary sinuses bilaterally. Soft tissue swelling over the left orbit. No evidence of orbital fracture. Zygomatic arches and mandible are intact. Orbital soft tissues are unremarkable. IMPRESSION: Soft tissue swelling over the left orbit. No evidence of orbital or facial fracture. Small air-fluid levels in the maxillary sinuses bilaterally felt to be related to acute sinusitis. No acute intracranial abnormality. Electronically Signed   By: Charlett Nose M.D.   On: 11/05/2015 11:56   Dg Chest Portable 1 View  11/05/2015  CLINICAL DATA:  Altered mental status, bruising to LEFT eye, fall, diabetes mellitus, hypertension, COPD EXAM: PORTABLE CHEST 1 VIEW COMPARISON:   10/24/2015; correlation CT chest 10/29/2015 FINDINGS: Borderline enlargement of cardiac silhouette. Atherosclerotic calcification aorta. Minimal pulmonary vascular congestion. New interstitial infiltrates bilaterally asymmetrically greater on RIGHT question asymmetric edema. Associated RIGHT pleural effusion. No pneumothorax. Bones demineralized without definite fracture. IMPRESSION: Question mild asymmetric pulmonary edema and RIGHT pleural effusion. Electronically Signed   By: Ulyses Southward M.D.   On: 11/05/2015 11:06   Ct Maxillofacial Wo Cm  11/05/2015  CLINICAL DATA:  Altered mental status. Lost balance and fell on Sunday. Headache. Swelling and bruising to left frontal region. EXAM: CT HEAD WITHOUT CONTRAST CT MAXILLOFACIAL WITHOUT CONTRAST TECHNIQUE: Multidetector CT imaging of the head and maxillofacial structures were performed using the standard protocol without intravenous contrast. Multiplanar CT image reconstructions of the maxillofacial structures were also generated. COMPARISON:  09/28/2015 FINDINGS: CT HEAD FINDINGS No acute intracranial abnormality. Specifically, no hemorrhage, hydrocephalus, mass lesion, acute infarction, or significant intracranial injury. No acute calvarial abnormality. CT MAXILLOFACIAL FINDINGS Small air-fluid levels in the maxillary sinuses bilaterally. Soft tissue swelling over the left orbit. No evidence of orbital fracture. Zygomatic arches and mandible are intact. Orbital soft tissues are unremarkable. IMPRESSION: Soft tissue swelling over the left orbit. No evidence of orbital or facial fracture. Small air-fluid levels in the maxillary sinuses bilaterally felt to be related to acute sinusitis. No acute intracranial abnormality. Electronically Signed   By: Charlett Nose M.D.   On: 11/05/2015 11:56    EKG: Independently reviewed.Atrial fibrillation Ventricular premature complex Low voltage, precordial leads Anteroseptal infarct, old Assessment/Plan Principal Problem:    Acute respiratory distress (HCC) Active Problems:   Anxiety disorder   Peripheral neuropathy (HCC)   Asthma   Diabetes mellitus, insulin dependent (IDDM), uncontrolled (HCC)   HTN (hypertension)   Pleural effusion   Acute kidney injury superimposed on chronic kidney disease (HCC)   Anemia   Hyperkalemia   Acute encephalopathy  #1. Acute respiratory distress related to asthma exacerbation in the setting of pleural effusion. Chest x-ray with mild pulmonary edema and right pleural effusion and anemia. Oxygen saturation level 95% on room air. BNP of 185. Audible wheezing. Of note CT of the chest done last week with groundglass nodules recommending follow-up in 3 month's -Admit to telemetry -Continue oxygen supplementation -Solu-Medrol 125 mg in the ED then 60 mg every 6 hours -Nebulizers every 4 hours -Monitor closely  #2. Acute encephalopathy. Likely related to above. Chest xray without infiltrate urinalysis without bacteria or WBC. Improving on admission. CT of the head without acute abnormality. -See #1 -Folate B-12 -Monitor -ABG  #3. Hyperkalemia. Potassium level 6.0 related to aki in setting of uncontrolled diabetes -Kayexalate -improved DM controll   #4. 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 creatinine 1.19 on admission -hold nephrotoxins -Urine output  5. Anemia. Hemoglobin 9.9 on admission. Chart review indicates hemoglobin 11.8 one month ago. Likely  some chronic disease contributing. On eliquis -FOBT -Serial CBCs - Consider GI consult  #6. Diabetes. Serum glucose 322 on admission. An ion gap 10. Home regimen includes Lantus and sliding scale -Obtain hemoglobin A1c -12 units of insulin in the ED -Continue home Lantus at half her dose -Sliding scale for optimal control -Gentle IV fluid    Code Status: DNR DVT Prophylaxis: Family Communication: at bedside Disposition Plan: may need placement  Time spent: 65 minutes  [facility]  M [facility]

## 2015-11-05 NOTE — Progress Notes (Signed)
5:59 report given to Melody RN .Available meds given Flu swab and respiratory panel obtained and sent to lab 1830 Pt transferred to 2 C by bed. Yellow thin ring given to pt's daughter.  pt' belongings with pt .family came down with pt. Pt awake and coherent but verbalized feeling nervous.On 4l /mNC Endorsed pt toMelody

## 2015-11-05 NOTE — Progress Notes (Signed)
Report obtained from [facility] on [facility].  Pt is still alert and in no major distress on bipap per report.  Multiple meds/labs are pending on worklist (solumedrol, levaquin, kayexelate)  Asked Pattricia Boss to give pt's late meds and collect late labs prior to transfer

## 2015-11-06 DIAGNOSIS — F419 Anxiety disorder, unspecified: Secondary | ICD-10-CM

## 2015-11-06 DIAGNOSIS — R0600 Dyspnea, unspecified: Secondary | ICD-10-CM

## 2015-11-06 DIAGNOSIS — N179 Acute kidney failure, unspecified: Secondary | ICD-10-CM

## 2015-11-06 DIAGNOSIS — N189 Chronic kidney disease, unspecified: Secondary | ICD-10-CM

## 2015-11-06 DIAGNOSIS — D649 Anemia, unspecified: Secondary | ICD-10-CM

## 2015-11-06 DIAGNOSIS — G9340 Encephalopathy, unspecified: Secondary | ICD-10-CM

## 2015-11-06 LAB — FOLATE RBC
Folate, Hemolysate: 382.4 ng/mL
Folate, RBC: 1250 ng/mL (ref >498)
Hematocrit: 30.6 % — ABNORMAL LOW (ref 34.0–46.6)

## 2015-11-06 LAB — GLUCOSE, CAPILLARY
GLUCOSE-CAPILLARY: 267 mg/dL — AB (ref 65–99)
Glucose-Capillary: 302 mg/dL — ABNORMAL HIGH (ref 65–99)
Glucose-Capillary: 331 mg/dL — ABNORMAL HIGH (ref 65–99)
Glucose-Capillary: 265 mg/dL — ABNORMAL HIGH (ref 65–99)

## 2015-11-06 LAB — CBC
HEMATOCRIT: 30 % — AB (ref 36.0–46.0)
HEMOGLOBIN: 8.9 g/dL — AB (ref 12.0–15.0)
MCH: 25.6 pg — ABNORMAL LOW (ref 26.0–34.0)
MCHC: 29.7 g/dL — AB (ref 30.0–36.0)
MCV: 86.2 fL (ref 78.0–100.0)
Platelets: 333 10*3/uL (ref 150–400)
RBC: 3.48 MIL/uL — ABNORMAL LOW (ref 3.87–5.11)
RDW: 15.6 % — ABNORMAL HIGH (ref 11.5–15.5)
WBC: 5.7 10*3/uL (ref 4.0–10.5)

## 2015-11-06 LAB — BASIC METABOLIC PANEL
ANION GAP: 8 (ref 5–15)
Anion gap: 10 (ref 5–15)
BUN: 32 mg/dL — ABNORMAL HIGH (ref 6–20)
BUN: 35 mg/dL — AB (ref 6–20)
CHLORIDE: 93 mmol/L — AB (ref 101–111)
CO2: 34 mmol/L — ABNORMAL HIGH (ref 22–32)
CO2: 34 mmol/L — AB (ref 22–32)
CREATININE: 1.44 mg/dL — AB (ref 0.44–1.00)
Calcium: 9 mg/dL (ref 8.9–10.3)
Calcium: 9 mg/dL (ref 8.9–10.3)
Chloride: 98 mmol/L — ABNORMAL LOW (ref 101–111)
Creatinine, Ser: 1.13 mg/dL — ABNORMAL HIGH (ref 0.44–1.00)
GFR calc Af Amer: 54 mL/min — ABNORMAL LOW (ref >60)
GFR calc Af Amer: 41 mL/min — ABNORMAL LOW (ref >60)
GFR calc non Af Amer: 35 mL/min — ABNORMAL LOW (ref >60)
GFR, EST NON AFRICAN AMERICAN: 47 mL/min — AB (ref >60)
GLUCOSE: 236 mg/dL — AB (ref 65–99)
GLUCOSE: 341 mg/dL — AB (ref 65–99)
POTASSIUM: 5.3 mmol/L — AB (ref 3.5–5.1)
Potassium: 5.2 mmol/L — ABNORMAL HIGH (ref 3.5–5.1)
SODIUM: 137 mmol/L (ref 135–145)
Sodium: 140 mmol/L (ref 135–145)

## 2015-11-06 LAB — OCCULT BLOOD X 1 CARD TO LAB, STOOL: FECAL OCCULT BLD: NEGATIVE

## 2015-11-06 LAB — MRSA PCR SCREENING: MRSA BY PCR: NEGATIVE

## 2015-11-06 LAB — RPR: RPR: NONREACTIVE

## 2015-11-06 MED ORDER — FUROSEMIDE 10 MG/ML IJ SOLN
20.0000 mg | Freq: Once | INTRAMUSCULAR | Status: AC
  Administered 2015-11-06: 20 mg via INTRAVENOUS
  Filled 2015-11-06: qty 2

## 2015-11-06 MED ORDER — INSULIN ASPART 100 UNIT/ML ~~LOC~~ SOLN
5.0000 [IU] | Freq: Three times a day (TID) | SUBCUTANEOUS | Status: DC
  Administered 2015-11-06 – 2015-11-07 (×6): 5 [IU] via SUBCUTANEOUS

## 2015-11-06 MED ORDER — INSULIN GLARGINE 100 UNIT/ML ~~LOC~~ SOLN
30.0000 [IU] | Freq: Every day | SUBCUTANEOUS | Status: DC
  Administered 2015-11-06: 30 [IU] via SUBCUTANEOUS
  Filled 2015-11-06 (×2): qty 0.3

## 2015-11-06 MED ORDER — CETYLPYRIDINIUM CHLORIDE 0.05 % MT LIQD
7.0000 mL | Freq: Two times a day (BID) | OROMUCOSAL | Status: DC
  Administered 2015-11-06 – 2015-11-08 (×4): 7 mL via OROMUCOSAL

## 2015-11-06 MED ORDER — GLUCERNA SHAKE PO LIQD
237.0000 mL | Freq: Three times a day (TID) | ORAL | Status: DC
  Administered 2015-11-06 – 2015-11-07 (×2): 237 mL via ORAL

## 2015-11-06 MED ORDER — SODIUM POLYSTYRENE SULFONATE 15 GM/60ML PO SUSP
30.0000 g | Freq: Once | ORAL | Status: AC
  Administered 2015-11-06: 30 g via ORAL
  Filled 2015-11-06: qty 120

## 2015-11-06 MED ORDER — IPRATROPIUM-ALBUTEROL 0.5-2.5 (3) MG/3ML IN SOLN: 3.0000 mL | RESPIRATORY_TRACT | Status: DC | PRN

## 2015-11-06 NOTE — NC FL2 (Signed)
[place] MEDICAID FL2 LEVEL OF CARE SCREENING TOOL     IDENTIFICATION  Patient Name: [name] Birthdate: 09-10-1941 Sex: female Admission Date (Current Location): 11/05/2015  [facility] and [place] Number:  Producer, television/film/video and Address:  [facility], [address] [place], [place] [address]      Provider Number: [id number]  Attending Physician Name and Address:  Cathren Harsh, MD  Relative Name and Phone Number:       Current Level of Care: Hospital Recommended Level of Care: Skilled Nursing Facility Prior Approval Number:    Date Approved/Denied:   PASRR Number: [id number]  Discharge Plan: SNF    Current Diagnoses: Patient Active Problem List   Diagnosis Date Noted  . Dyspnea 11/05/2015  . Pleural effusion 11/05/2015  . Acute respiratory distress (HCC) 11/05/2015  . Acute kidney injury superimposed on chronic kidney disease (HCC) 11/05/2015  . Acute respiratory failure (HCC) 11/05/2015  . Anemia 11/05/2015  . Hyperkalemia 11/05/2015  . Acute encephalopathy 11/05/2015  . COPD (chronic obstructive pulmonary disease) (HCC)   . Absolute anemia   . Diabetes mellitus with complication (HCC)   . Atrial fibrillation with rapid ventricular response (HCC) 10/02/2015  . Metabolic encephalopathy 09/28/2015  . Acute respiratory failure with hypoxia (HCC) 09/28/2015  . CAP (community acquired pneumonia) 09/28/2015  . Diabetes mellitus, insulin dependent (IDDM), uncontrolled (HCC) 09/28/2015  . HTN (hypertension) 09/28/2015  . Acute UTI 09/28/2015  . Left-sided weakness 09/28/2015  . Left leg weakness   . Anxiety disorder 01/06/2010  . Peripheral neuropathy (HCC) 01/06/2010  . Asthma 01/06/2010    Orientation RESPIRATION BLADDER Height & Weight     Self, Situation, Place  O2, Other (Comment) (3.5L [place], has needed bipap- no plan to DC on bipap at this time) Continent Weight: 182 lb 9.6 oz (82.827 kg) Height:  5' (152.4 cm)  BEHAVIORAL  SYMPTOMS/MOOD NEUROLOGICAL BOWEL NUTRITION STATUS      Continent Diet (cardiac)  AMBULATORY STATUS COMMUNICATION OF NEEDS Skin   Limited Assist Verbally Normal                       Personal Care Assistance Level of Assistance  Bathing, Dressing Bathing Assistance: Limited assistance   Dressing Assistance: Limited assistance     Functional Limitations Info             SPECIAL CARE FACTORS FREQUENCY  PT (By licensed PT), OT (By licensed OT)     PT Frequency: 5/wk OT Frequency: 5/wk            Contractures      Additional Factors Info  Code Status, Allergies, Isolation Precautions Code Status Info: DNR Allergies Info: Etodolac, Xarelto     Isolation Precautions Info: Droplet     Current Medications (11/06/2015):  This is the current hospital active medication list Current Facility-Administered Medications  Medication Dose Route Frequency Provider Last Rate Last Dose  . 0.9 %  sodium chloride infusion  250 mL Intravenous PRN Gwenyth Bender, NP      . acetaminophen (TYLENOL) tablet 650 mg  650 mg Oral Q6H PRN Gwenyth Bender, NP       Or  . acetaminophen (TYLENOL) suppository 650 mg  650 mg Rectal Q6H PRN Lesle Chris Black, NP      . albuterol (PROVENTIL) (2.5 MG/3ML) 0.083% nebulizer solution 2.5 mg  2.5 mg Nebulization Q4H PRN Gwenyth Bender, NP      . ALPRAZolam (  XANAX) tablet 0.25 mg  0.25 mg Oral BID PRN Gwenyth Bender, NP   0.25 mg at 11/05/15 2040  . antiseptic oral rinse (CPC / CETYLPYRIDINIUM CHLORIDE 0.05%) solution 7 mL  7 mL Mouth Rinse BID Ozella Rocks, MD   7 mL at 11/06/15 0905  . apixaban (ELIQUIS) tablet 5 mg  5 mg Oral BID Gwenyth Bender, NP   5 mg at 11/06/15 8119  . atorvastatin (LIPITOR) tablet 10 mg  10 mg Oral Daily Gwenyth Bender, NP   10 mg at 11/06/15 0905  . diltiazem (CARDIZEM CD) 24 hr capsule 120 mg  120 mg Oral Daily Gwenyth Bender, NP   120 mg at 11/06/15 0905  . DULoxetine (CYMBALTA) DR capsule 60 mg  60 mg Oral Daily Gwenyth Bender, NP   60  mg at 11/06/15 0905  . feeding supplement (GLUCERNA SHAKE) (GLUCERNA SHAKE) liquid 237 mL  237 mL Oral TID BM Ripudeep K Rai, MD      . gabapentin (NEURONTIN) capsule 400 mg  400 mg Oral TID Gwenyth Bender, NP   400 mg at 11/06/15 0905  . insulin aspart (novoLOG) injection 0-15 Units  0-15 Units Subcutaneous TID WC Gwenyth Bender, NP   11 Units at 11/06/15 [phone]  . insulin aspart (novoLOG) injection 0-5 Units  0-5 Units Subcutaneous QHS Gwenyth Bender, NP   0 Units at 11/05/15 2200  . insulin aspart (novoLOG) injection 5 Units  5 Units Subcutaneous TID WC Ripudeep Roselind Klus Luo, MD   5 Units at 11/06/15 0906  . insulin glargine (LANTUS) injection 30 Units  30 Units Subcutaneous QHS Ripudeep K Rai, MD      . ipratropium-albuterol (DUONEB) 0.5-2.5 (3) MG/3ML nebulizer solution 3 mL  3 mL Nebulization Q4H PRN Ripudeep K Rai, MD      . levofloxacin (LEVAQUIN) IVPB 750 mg  750 mg Intravenous Q48H Lauren D Bajbus, RPH   750 mg at 11/05/15 1906  . loratadine (CLARITIN) tablet 10 mg  10 mg Oral Daily Gwenyth Bender, NP   10 mg at 11/06/15 0905  . methylPREDNISolone sodium succinate (SOLU-MEDROL) 125 mg/2 mL injection 60 mg  60 mg Intravenous Q6H Gwenyth Bender, NP   60 mg at 11/06/15 1035  . ondansetron (ZOFRAN) tablet 4 mg  4 mg Oral Q6H PRN Gwenyth Bender, NP       Or  . ondansetron [facility]) injection 4 mg  4 mg Intravenous Q6H PRN Gwenyth Bender, NP   4 mg at 11/05/15 2138  . sodium chloride flush (NS) 0.9 % injection 3 mL  3 mL Intravenous Q12H Gwenyth Bender, NP   3 mL at 11/05/15 1808  . sodium chloride flush (NS) 0.9 % injection 3 mL  3 mL Intravenous Q12H Gwenyth Bender, NP   3 mL at 11/06/15 0908  . sodium chloride flush (NS) 0.9 % injection 3 mL  3 mL Intravenous PRN Lesle Chris Black, NP      . sodium polystyrene (KAYEXALATE) 15 GM/60ML suspension 30 g  30 g Oral Once Ripudeep Armonii Sieh Luo, MD         Discharge Medications: Please see discharge summary for a list of discharge medications.  Relevant Imaging  Results:  Relevant Lab Results:   Additional Information SS#: [id number]  Izora Ribas, [place]

## 2015-11-06 NOTE — Progress Notes (Signed)
Patient is on nasal cannula, SPO2 96%. Tolerating fine, patient is in no distress. Easy to wake and aware. BIPAP not needed at this time. RT will continue to monitor patient for need of BIPAP.

## 2015-11-06 NOTE — Progress Notes (Signed)
[facility]                                                                              Patient Demographics  [name], is a 75 y.o. female, DOB - [birth year], ZOX:[id number]  Admit date - 11/05/2015   Admitting Physician Abigail Rocks, MD  Outpatient Primary MD for the patient is Abigail Otto, MD  LOS - 1   Chief Complaint  Patient presents with  . Shortness of Breath       Brief HPI   [name] is a 75 y.o. female with past medical history includes A. fib, asthma, recent pneumonia, diabetes, hypertension presented to ED with worsening shortness of breath and gradual onset confusion. Initial evaluation in the emergency department reveals acute respiratory distress likely related to asthma exacerbation in the setting of all pulmonary edema/pleural effusion, hyperkalemia, anemia hyperglycemia. Patient was admitted to stepdown unit with BiPAP   Assessment & Plan    Principal Problem: Acute hypoxic and hypercapnic respiratory failure in the setting of asthma exacerbation, pleural effusion.  - Patient had audible wheezing at the time of admission.  - CT chest showed groundglass nodules, recommended follow-up in 3 months.  - Continue scheduled nebs, IV Solu-Medrol, currently off BiPAP since 5 AM - Continue IV Levaquin -Influenza panel negative   Acute encephalopathy: Resolved, likely due to #1, likely due to hypercarbia - Chest x-ray showed no acute infiltrates, UA negative for UTI  - CT head showed no acute infarct.  - ABG showed hypercarbia     Hyperkalemia. 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 creatinine 1.19 on admission - Follow I's and O's, creatinine trended up to 1.4 this morning     Anemia. Hemoglobin 9.9 on admission. Chart review indicates hemoglobin 11.8 one month ago. Likely some chronic disease contributing. On eliquis -FOBT, anemia  panel showed iron saturation ratio is 10, ferritin 122, outpatient workup by PCP    Diabetes. Serum glucose 322 on admission. Anion gap 10. Home regimen includes Lantus and sliding scale - Uncontrolled, obtain hemoglobin A1c  -Patient only received half dose of Lantus on admission, currently blood sugars elevated   - increase Lantus to 30 units, meal coverage added, sliding scale insulin  Atrial fibrillation - Currently rate controlled, continue Cardizem - CHADSVASC 4   Essential hypertension Continue Cardizem   Code Status: dnr   Family Communication: Discussed in detail with the patient, all imaging results, lab results explained to the patient    Disposition Plan:   Time Spent in minutes 25 minutes  Procedures  Bipap CXR  Consults   None   DVT Prophylaxis  apixaban  Medications  Scheduled Meds: . antiseptic oral rinse  7 mL Mouth Rinse BID  . apixaban  5 mg Oral BID  . atorvastatin  10 mg Oral Daily  . diltiazem  120 mg Oral Daily  . DULoxetine  60 mg Oral Daily  . gabapentin  400 mg Oral TID  . insulin aspart  0-15 Units Subcutaneous TID WC  . insulin aspart  0-5 Units Subcutaneous QHS  . insulin aspart  5 Units Subcutaneous TID WC  . insulin glargine  30 Units Subcutaneous QHS  . levofloxacin (LEVAQUIN) IV  750 mg Intravenous Q48H  . loratadine  10 mg Oral Daily  . methylPREDNISolone (SOLU-MEDROL) injection  60 mg Intravenous Q6H  . sodium chloride flush  3 mL Intravenous Q12H  . sodium chloride flush  3 mL Intravenous Q12H   Continuous Infusions:  PRN Meds:.sodium chloride, acetaminophen **OR** acetaminophen, albuterol, ALPRAZolam, ipratropium-albuterol, ondansetron **OR** ondansetron (ZOFRAN) IV, sodium chloride flush   Antibiotics   Anti-infectives    Start     Dose/Rate Route Frequency Ordered Stop   11/05/15 1800  levofloxacin (LEVAQUIN) IVPB 750 mg     750 mg 100 mL/hr over 90 Minutes Intravenous Every 48 hours 11/05/15 1733           Subjective:   [name] was seen and examined today.  Overall improving, off BiPAP this morning. No fevers or chills. Patient denies dizziness, chest pain, abdominal pain, N/V/D/C, new weakness, numbess, tingling. No acute events overnight.    Objective:   Blood pressure 137/65, pulse 102, temperature 97.7 F (36.5 C), temperature source Axillary, resp. rate 22, height 5' (1.524 m), weight 82.827 kg (182 lb 9.6 oz), SpO2 94 %.  Wt Readings from Last 3 Encounters:  11/05/15 82.827 kg (182 lb 9.6 oz)  10/05/15 86.909 kg (191 lb 9.6 oz)  04/16/10 87.544 kg (193 lb)     Intake/Output Summary (Last 24 hours) at 11/06/15 1024 Last data filed at 11/06/15 0900  Gross per 24 hour  Intake   1060 ml  Output    225 ml  Net    835 ml    Exam  General: Alert and oriented x 3, NAD  HEENT:  PERRLA, EOMI, Anicteric Sclera, mucous membranes moist.   Neck: Supple, no JVD, no masses  CVS: S1 S2 auscultated, no rubs, murmurs or gallops. Regular rate and rhythm.  Respiratory: Decreased breath sound at the bases, mild scattered wheezing  Abdomen: Soft, nontender, nondistended, + bowel sounds  Ext: no cyanosis clubbing or edema  Neuro: AAOx3, Cr N's II- XII. Strength 5/5 upper and lower extremities bilaterally  Skin: No rashes  Psych: Normal affect and demeanor, alert and oriented x3    Data Review   Micro Results Recent Results (from the past 240 hour(s))  MRSA PCR Screening     Status: None   Collection Time: 11/05/15  7:09 PM  Result Value Ref Range Status   MRSA by PCR NEGATIVE NEGATIVE Final    Comment:        The GeneXpert MRSA Assay (FDA approved for NASAL specimens only), is one component of a comprehensive MRSA colonization surveillance program. It is not intended to diagnose MRSA infection nor to guide or monitor treatment for MRSA infections.     Radiology Reports Dg Chest 2 View  10/24/2015  CLINICAL DATA:  Bilateral pneumonia EXAM: CHEST  2 VIEW  COMPARISON:  10/10/2015 FINDINGS: The opacity neighboring but not entirely explained by the right first costochondral junction is unchanged. There is a background of diffuse interstitial coarsening compatible with chronic bronchitis. There is no edema, consolidation, effusion, or pneumothorax. Normal heart size. No effusion or pneumothorax. IMPRESSION: Unchanged opacity at the medial right apex. Recommend chest CT to evaluate for nodule (contrast not essential). Electronically Signed   By: Marnee Spring M.D.   On: 10/24/2015 10:43   Dg Chest 2 View  10/11/2015  CLINICAL  DATA:  76 year old female with history of cough and shortness of breath. Pneumonia. EXAM: CHEST  2 VIEW COMPARISON:  Chest x-ray 10/01/2015. FINDINGS: Lung volumes are normal. In the medial aspect of the right upper lobe there is increased density, which may represent an area of airspace consolidation. No pleural effusions. No pneumothorax. No pulmonary nodule or mass noted. Pulmonary vasculature and the cardiomediastinal silhouette are within normal limits. Atherosclerotic calcifications in the thoracic aorta. IMPRESSION: 1. Potential airspace consolidation in the medial aspect of the right upper lobe concerning for pneumonia. Followup PA and lateral chest X-ray is recommended in 3-4 weeks following trial of antibiotic therapy to ensure resolution and exclude underlying malignancy. Should this fail to resolve at that time, further evaluation with contrast enhanced chest CT would be recommended to exclude malignancy. 2. Atherosclerosis. These results will be called to the ordering clinician or representative by the Radiologist Assistant, and communication documented in the PACS or zVision Dashboard. Electronically Signed   By: Trudie Reed M.D.   On: 10/11/2015 08:22   Ct Head Wo Contrast  11/05/2015  CLINICAL DATA:  Altered mental status. Lost balance and fell on Sunday. Headache. Swelling and bruising to left frontal region. EXAM: CT  HEAD WITHOUT CONTRAST CT MAXILLOFACIAL WITHOUT CONTRAST TECHNIQUE: Multidetector CT imaging of the head and maxillofacial structures were performed using the standard protocol without intravenous contrast. Multiplanar CT image reconstructions of the maxillofacial structures were also generated. COMPARISON:  09/28/2015 FINDINGS: CT HEAD FINDINGS No acute intracranial abnormality. Specifically, no hemorrhage, hydrocephalus, mass lesion, acute infarction, or significant intracranial injury. No acute calvarial abnormality. CT MAXILLOFACIAL FINDINGS Small air-fluid levels in the maxillary sinuses bilaterally. Soft tissue swelling over the left orbit. No evidence of orbital fracture. Zygomatic arches and mandible are intact. Orbital soft tissues are unremarkable. IMPRESSION: Soft tissue swelling over the left orbit. No evidence of orbital or facial fracture. Small air-fluid levels in the maxillary sinuses bilaterally felt to be related to acute sinusitis. No acute intracranial abnormality. Electronically Signed   By: Charlett Nose M.D.   On: 11/05/2015 11:56   Ct Chest Wo Contrast  10/29/2015  CLINICAL DATA:  Abnormal chest x-ray follow-up. Cough, shortness of breath and wheezing since 09/28/2015. EXAM: CT CHEST WITHOUT CONTRAST TECHNIQUE: Multidetector CT imaging of the chest was performed following the standard protocol without IV contrast. COMPARISON:  Prior chest x-rays from 01/05 and 10/13/2014 FINDINGS: Mediastinum/Nodes: No breast masses, supraclavicular or axillary lymphadenopathy. Small scattered lymph nodes are noted. The heart is normal in size. No pericardial effusion. The aorta is normal in caliber. Minimal atherosclerotic calcifications at the aortic arch. Coronary artery calcifications are noted. Scattered mediastinal and hilar lymph nodes but no mass or overt adenopathy. The esophagus is grossly normal. Lungs/Pleura: Breathing motion artifact does somewhat limit the examination. There is a the patchy  predominantly perihilar pattern of ground-glass opacity along with more discrete ground-glass nodules. There is also a small to moderate right pleural effusion with some overlying atelectasis. This is mostly some type of atypical inflammatory or infectious process and un likely pulmonary edema. Patient may require bronchoscopy or thoracentesis if antibiotic therapy is not working. I would recommend a followup CT scan in 3 months two reassess these findings. Upper abdomen: No significant findings. Musculoskeletal: No significant findings. There is evidence of prior left-sided thoracotomy with a left lower rib resection. IMPRESSION: 1. Patchy ground-glass opacity and more discrete ground-glass nodules most likely reflecting some type of atypical inflammatory or infectious process. Recommend followup noncontrast chest CT in  3 months to assure resolution. High-resolution chest CT should be considered at that time. 2. Small scattered mediastinal and hilar lymph nodes but no mass or adenopathy. 3. Scattered aortic and coronary artery calcifications. Electronically Signed   By: Rudie Meyer M.D.   On: 10/29/2015 10:51   Dg Chest Portable 1 View  11/05/2015  CLINICAL DATA:  Altered mental status, bruising to LEFT eye, fall, diabetes mellitus, hypertension, COPD EXAM: PORTABLE CHEST 1 VIEW COMPARISON:  10/24/2015; correlation CT chest 10/29/2015 FINDINGS: Borderline enlargement of cardiac silhouette. Atherosclerotic calcification aorta. Minimal pulmonary vascular congestion. New interstitial infiltrates bilaterally asymmetrically greater on RIGHT question asymmetric edema. Associated RIGHT pleural effusion. No pneumothorax. Bones demineralized without definite fracture. IMPRESSION: Question mild asymmetric pulmonary edema and RIGHT pleural effusion. Electronically Signed   By: Ulyses Southward M.D.   On: 11/05/2015 11:06   Ct Maxillofacial Wo Cm  11/05/2015  CLINICAL DATA:  Altered mental status. Lost balance and fell on  Sunday. Headache. Swelling and bruising to left frontal region. EXAM: CT HEAD WITHOUT CONTRAST CT MAXILLOFACIAL WITHOUT CONTRAST TECHNIQUE: Multidetector CT imaging of the head and maxillofacial structures were performed using the standard protocol without intravenous contrast. Multiplanar CT image reconstructions of the maxillofacial structures were also generated. COMPARISON:  09/28/2015 FINDINGS: CT HEAD FINDINGS No acute intracranial abnormality. Specifically, no hemorrhage, hydrocephalus, mass lesion, acute infarction, or significant intracranial injury. No acute calvarial abnormality. CT MAXILLOFACIAL FINDINGS Small air-fluid levels in the maxillary sinuses bilaterally. Soft tissue swelling over the left orbit. No evidence of orbital fracture. Zygomatic arches and mandible are intact. Orbital soft tissues are unremarkable. IMPRESSION: Soft tissue swelling over the left orbit. No evidence of orbital or facial fracture. Small air-fluid levels in the maxillary sinuses bilaterally felt to be related to acute sinusitis. No acute intracranial abnormality. Electronically Signed   By: Charlett Nose M.D.   On: 11/05/2015 11:56    CBC  Recent Labs Lab 11/05/15 1113 11/06/15 0241  WBC 7.9 5.7  HGB 9.9* 8.9*  HCT 35.2* 30.0*  PLT 382 333  MCV 87.3 86.2  MCH 24.6* 25.6*  MCHC 28.1* 29.7*  RDW 15.4 15.6*  LYMPHSABS 0.5*  --   MONOABS 0.4  --   EOSABS 0.0  --   BASOSABS 0.0  --     Chemistries   Recent Labs Lab 11/05/15 1113 11/06/15 0241 11/06/15 0728  NA 138 140 137  K 6.0* 5.3* 5.2*  CL 94* 98* 93*  CO2 34* 34* 34*  GLUCOSE 322* 236* 341*  BUN 34* 32* 35*  CREATININE 1.19* 1.13* 1.44*  CALCIUM 9.3 9.0 9.0  AST 17  --   --   ALT 18  --   --   ALKPHOS 90  --   --   BILITOT 0.5  --   --    ------------------------------------------------------------------------------------------------------------------ estimated creatinine clearance is 33.2 mL/min (by C-G formula based on Cr of  1.44). ------------------------------------------------------------------------------------------------------------------ No results for input(s): HGBA1C in the last 72 hours. ------------------------------------------------------------------------------------------------------------------ No results for input(s): CHOL, HDL, LDLCALC, TRIG, CHOLHDL, LDLDIRECT in the last 72 hours. ------------------------------------------------------------------------------------------------------------------  Recent Labs  11/05/15 1553  TSH 2.015   ------------------------------------------------------------------------------------------------------------------  Recent Labs  11/05/15 1553  VITAMINB12 608  FOLATE 15.0  FERRITIN 122  TIBC 239*  IRON 25*  RETICCTPCT 2.2    Coagulation profile No results for input(s): INR, PROTIME in the last 168 hours.  No results for input(s): DDIMER in the last 72 hours.  Cardiac Enzymes  Recent Labs Lab 11/05/15 1113  TROPONINI <0.03   ------------------------------------------------------------------------------------------------------------------ Invalid input(s): POCBNP   Recent Labs  11/05/15 1630 11/05/15 1858 11/05/15 2127 11/06/15 0750  GLUCAP 260* 258* 190* 302*     RAI,RIPUDEEP M.D. [facility] 11/06/2015, 10:24 AM  Pager: [phone] Between 7am to 7pm - call Pager - [phone]  After 7pm go to [email or web address] - password [email or web address]  Call night coverage person covering after 7pm

## 2015-11-06 NOTE — Progress Notes (Signed)
Initial Nutrition Assessment  DOCUMENTATION CODES:   Obesity unspecified  INTERVENTION:   Glucerna Shake po TID, each supplement provides 220 kcal and 10 grams of protein  NUTRITION DIAGNOSIS:   Increased nutrient needs related to chronic illness as evidenced by estimated needs  GOAL:   Patient will meet greater than or equal to 90% of their needs  MONITOR:   PO intake, Supplement acceptance, Labs, Weight trends, I & O's  REASON FOR ASSESSMENT:   Consult COPD Protocol  ASSESSMENT:   75 y.o. Female with PMH of A. fib, asthma, recent pneumonia, diabetes, HTN presented to ED with worsening shortness of breath and gradual onset confusion. Initial evaluation in the emergency department reveals acute respiratory distress likely related to asthma exacerbation in the setting of all pulmonary edema/pleural effusion, hyperkalemia, anemia hyperglycemia. Patient was admitted to stepdown unit with BiPAP.  Patient reports she's not eating well.  Appetite is decreased.  PO intake per flowsheet records, however, is 60% this AM.  Endorses a 10 lb weight loss in the last 2-3 weeks (5%); severe for time frame.  Nutrient needs increased given chronic illness (ie COPD).  Would benefit from addition of oral nutrition supplements.  Pt agreeable.  RD to order.  Nutrition focused physical exam completed.  No muscle or subcutaneous fat depletion noticed.  Diet Order:  Diet heart healthy/carb modified Room service appropriate?: Yes; Fluid consistency:: Thin  Skin:  Reviewed, no issues  Last BM:  1/31   CBG (last 3)   Recent Labs  11/05/15 1858 11/05/15 2127 11/06/15 0750  GLUCAP 258* 190* 302*   Height:   Ht Readings from Last 1 Encounters:  11/05/15 5' (1.524 m)    Weight:   Wt Readings from Last 1 Encounters:  11/05/15 182 lb 9.6 oz (82.827 kg)    Wt Readings from Last 10 Encounters:  11/05/15 182 lb 9.6 oz (82.827 kg)  10/05/15 191 lb 9.6 oz (86.909 kg)  04/16/10 193 lb  (87.544 kg)  04/16/10 193 lb 8 oz (87.771 kg)  03/17/10 191 lb (86.637 kg)  02/25/10 189 lb 2.1 oz (85.789 kg)  02/04/10 193 lb 4 oz (87.658 kg)  01/13/10 190 lb 6.1 oz (86.356 kg)  01/06/10 189 lb 8 oz (85.957 kg)    Ideal Body Weight:  45.4 kg  BMI:  Body mass index is 35.66 kg/(m^2).  Estimated Nutritional Needs:   Kcal:  1800-2000  Protein:  85-95 gm  Fluid:  1.8-2.0 L  EDUCATION NEEDS:   No education needs identified at this time  Maureen Chatters, RD, LDN Pager #: [phone] After-Hours Pager #: [phone]

## 2015-11-06 NOTE — Plan of Care (Signed)
Problem: Phase I Progression Outcomes Goal: Dyspnea controlled at rest Outcome: Progressing Pt is still dyspneic at rest on the .  With Bipap pt is calm and restful.

## 2015-11-06 NOTE — Evaluation (Signed)
Physical Therapy Evaluation Patient Details Name: [name] MRN: [id number] DOB: November 18, 1940 Today's Date: 11/06/2015   History of Present Illness  75 yo female admitted with respiratory acidosis with hypercarbia. PT currently has acute encephalopathy / fall. PMH COPD, DM, recent PNA, afib, HTN  Clinical Impression  Pt admitted with above diagnosis. Pt currently with functional limitations due to the deficits listed below (see PT Problem List). Pt was able to ambulate but with poor safety awareness and unsteady on feet as well as incontinence.  Would benefit from SNF prior to d/c home as pt reports she recently went home on O2 and was struggling. Will follow acutely.   Pt will benefit from skilled PT to increase their independence and safety with mobility to allow discharge to the venue listed below.      Follow Up Recommendations SNF;Supervision/Assistance - 24 hour    Equipment Recommendations  Other (comment) (TBA)    Recommendations for Other Services       Precautions / Restrictions Precautions Precautions: Fall Precaution Comments: oxygen ( currently 4L) Restrictions Weight Bearing Restrictions: No      Mobility  Bed Mobility Overal bed mobility: Modified Independent             General bed mobility comments: in chair on arrival.  Transfers Overall transfer level: Needs assistance Equipment used: Rolling walker (2 wheeled) Transfers: Sit to/from Stand Sit to Stand: Min assist         General transfer comment: pt with a tremor to bil UE with transfer sit to standwith increased anxiety with transfers. Pt needed steadying asssit initially even with RW as she stands impulsively.   Ambulation/Gait Ambulation/Gait assistance: Min assist Ambulation Distance (Feet): 35 Feet Assistive device: Rolling walker (2 wheeled) Gait Pattern/deviations: Step-through pattern;Decreased stride length;Shuffle;Drifts right/left;Wide base of support   Gait velocity interpretation:  Below normal speed for age/gender General Gait Details: Pt was able to ambulate with RW with fair safety.  Drifting right and left withRW.  Pt needed cues at times for safety.  Pt incontinent of urine dribbling while walking across floor.   Changed pts socks and linen and cleaned her.  Will use brief or pad next visit.  Pt limited by fatigue anyway and could not have ambulated further with desaturation with activity.   Stairs            Wheelchair Mobility    Modified Rankin (Stroke Patients Only)       Balance Overall balance assessment: Needs assistance Sitting-balance support: No upper extremity supported;Feet supported Sitting balance-Leahy Scale: Fair     Standing balance support: Bilateral upper extremity supported;During functional activity Standing balance-Leahy Scale: Poor Standing balance comment: Pt able stand statically with min assist with RW.               High level balance activites: Direction changes;Turns;Sudden stops High Level Balance Comments: min asssit for safety as pt cannot withstand challenges to balance with RW.              Pertinent Vitals/Pain Pain Assessment: No/denies pain SATURATION QUALIFICATIONS: (This note is used to comply with regulatory documentation for home oxygen)  Patient Saturations on Room Air at Rest = 86-90%  Patient Saturations on Room Air while Ambulating = 84%  Patient Saturations on 4-6 Liters of oxygen while Ambulating = 89-96%  Please briefly explain why patient needs home oxygen:Needed to incr O2 to 6LO2 during ambulation to keep sats >90%.  Pt on 4L at rest to keep sats >  90%.  Will most likely need home O2.    Home Living Family/patient expects to be discharged to:: Private residence Living Arrangements: Spouse/significant other Available Help at Discharge: Family;Available 24 hours/day Type of Home: House Home Access: Level entry     Home Layout: Two level;Full bath on main level;Able to live on main  level with bedroom/bathroom Home Equipment: Dan Humphreys - 2 wheels;Cane - single point;Wheelchair - [employer] - 4 wheels;Bedside commode;Shower seat;Hand held shower head Additional Comments: information from previous admission ( pt poor historian this admission)    Prior Function Level of Independence: Needs assistance   Gait / Transfers Assistance Needed: Per pt had been struggling the last week using a RW and home O2 at 2L  ADL's / Homemaking Assistance Needed: Assistance from husband for LB dressing        Hand Dominance   Dominant Hand: Right    Extremity/Trunk Assessment   Upper Extremity Assessment: Defer to OT evaluation RUE Deficits / Details: tremor noted     LUE Deficits / Details: tremor noted   Lower Extremity Assessment: Generalized weakness      Cervical / Trunk Assessment: Kyphotic  Communication   Communication: No difficulties  Cognition Arousal/Alertness: Awake/alert Behavior During Therapy: Impulsive Overall Cognitive Status: Impaired/Different from baseline Area of Impairment: Attention;Memory;Following commands;Safety/judgement;Problem solving;Awareness;Orientation Orientation Level: Disoriented to;Place;Time;Situation Current Attention Level: Sustained Memory: Decreased recall of precautions;Decreased short-term memory Following Commands: Follows one step commands inconsistently Safety/Judgement: Decreased awareness of safety;Decreased awareness of deficits Awareness: Intellectual Problem Solving: Slow processing General Comments: Pt reports that she fell cooking Christmas dinner this past Saturday and today is Sunday. Pt fixated on breakfast tray arrival and no recall of previous information provided by therapist. Pt needed cues for safety due to impulsive.     General Comments General comments (skin integrity, edema, etc.): left eye bruised.    Exercises General Exercises - Lower Extremity Ankle Circles/Pumps: AROM;Both;10 reps;Seated Long  Arc Quad: AROM;Both;10 reps;Seated Hip Flexion/Marching: AROM;Both;10 reps;Seated      Assessment/Plan    PT Assessment Patient needs continued PT services  PT Diagnosis Generalized weakness   PT Problem List Decreased balance;Decreased activity tolerance;Decreased mobility;Decreased knowledge of use of DME;Decreased safety awareness;Decreased knowledge of precautions;Cardiopulmonary status limiting activity;Decreased cognition  PT Treatment Interventions DME instruction;Gait training;Functional mobility training;Therapeutic activities;Therapeutic exercise;Balance training;Patient/family education;Cognitive remediation;Stair training   PT Goals (Current goals can be found in the Care Plan section) Acute Rehab PT Goals Patient Stated Goal: to get better and go home PT Goal Formulation: With patient Time For Goal Achievement: 11/20/15 Potential to Achieve Goals: Fair    Frequency Min 3X/week   Barriers to discharge Other (comment) (pt states she was struggling PTA. )      Co-evaluation               End of Session Equipment Utilized During Treatment: Gait belt;Oxygen Activity Tolerance: Patient limited by fatigue Patient left: in chair;with call bell/phone within reach;with chair alarm set Nurse Communication: Mobility status         Time: 1610-9604 PT Time Calculation (min) (ACUTE ONLY): 23 min   Charges:   PT Evaluation $PT Eval Moderate Complexity: 1 Procedure PT Treatments $Gait Training: 8-22 mins   PT G CodesTawni Millers F 11-26-15, 10:41 AM  [facility] [phone] (pager)

## 2015-11-06 NOTE — Plan of Care (Signed)
Problem: Phase I Progression Outcomes Goal: O2 sats > or equal 90% or at baseline Outcome: Completed/Met Date Met:  11/06/15 Pt maintains saturation > 90% on [place]/Bipap.

## 2015-11-06 NOTE — Progress Notes (Signed)
SATURATION QUALIFICATIONS: (This note is used to comply with regulatory documentation for home oxygen)  Patient Saturations on Room Air at Rest = 86-90%  Patient Saturations on Room Air while Ambulating = 84%  Patient Saturations on 4-6 Liters of oxygen while Ambulating = 89-96%  Please briefly explain why patient needs home oxygen:Needed to incr O2 to 6LO2 during ambulation to keep sats >90%.  Pt on 4L at rest to keep sats >90%.  Will most likely need home O2.  Thanks.   [facility] [phone] (pager)

## 2015-11-06 NOTE — Progress Notes (Signed)
Utilization review completed. Rosela Supak, RN, BSN. 

## 2015-11-06 NOTE — Clinical Social Work Placement (Signed)
   CLINICAL SOCIAL WORK PLACEMENT  NOTE  Date:  11/06/2015  Patient Details  Name: [name] MRN: [id number] Date of Birth: 11-Sep-1941  Clinical Social Work is seeking post-discharge placement for this patient at the Skilled  Nursing Facility level of care (*CSW will initial, date and re-position this form in  chart as items are completed):  Yes   Patient/family provided with [facility] Clinical Social Work Department's list of facilities offering this level of care within the geographic area requested by the patient (or if unable, by the patient's family).  Yes   Patient/family informed of their freedom to choose among providers that offer the needed level of care, that participate in Medicare, Medicaid or managed care program needed by the patient, have an available bed and are willing to accept the patient.  Yes   Patient/family informed of [facility]'s ownership interest in [facility] and [facility], as well as of the fact that they are under no obligation to receive care at these facilities.  PASRR submitted to EDS on 11/06/15     PASRR number received on 11/06/15     Existing PASRR number confirmed on       FL2 transmitted to all facilities in geographic area requested by pt/family on 11/06/15     FL2 transmitted to all facilities within larger geographic area on       Patient informed that his/her managed care company has contracts with or will negotiate with certain facilities, including the following:            Patient/family informed of bed offers received.  Patient chooses bed at       Physician recommends and patient chooses bed at      Patient to be transferred to   on  .  Patient to be transferred to facility by       Patient family notified on   of transfer.  Name of family member notified:        PHYSICIAN Please sign FL2, Please sign DNR     Additional Comment:    _______________________________________________ Izora Ribas,  LCSW 11/06/2015, 1:28 PM
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
## 2015-11-06 NOTE — Clinical Documentation Improvement (Signed)
Internal Medicine  Can the diagnosis of anemia be further specified? Please document findings in next progress note NOT in BPA drop down box. Thanks.   Iron deficiency Anemia  Nutritional anemia, including the nutrition or mineral deficits  Chronic Blood Loss Anemia, including the suspected or known cause  Anemia of chronic disease, including the associated chronic disease state  Other  Clinically Undetermined  Document any associated diagnoses/conditions.  Supporting Information:  CKD 2  Please exercise your independent, professional judgment when responding. A specific answer is not anticipated or expected.  Thank You,  Shellee Milo RN, BSN, CCDS Health Information Management [place] [phone]; Cell: [phone]

## 2015-11-06 NOTE — Clinical Social Work Note (Signed)
Clinical Social Work Assessment  Patient Details  Name: [name] MRN: [id number] Date of Birth: [birth year]  Date of referral:  11/06/15               Reason for consult:  Facility Placement                Permission sought to share information with:  Oceanographer granted to share information::  Yes, Verbal Permission Granted  Name::     [name]  Agency::  [facility] SN[facility]  Relationship::  dtr, husband  Contact Information:     Housing/Transportation Living arrangements for the past 2 months:  Single Family Home Source of Information:  Patient, Adult Children Patient Interpreter Needed:  None Criminal Activity/Legal Involvement Pertinent to Current Situation/Hospitalization:  No - Comment as needed Significant Relationships:  Adult Children Lives with:    Do you feel safe going back to the place where you live?  No Need for family participation in patient care:  Yes (Comment) (decision making)  Care giving concerns: pt lives at home with spouse- currently requiring more assistance that spouse can manage at home   Social Worker assessment / plan:  CSW spoke with pt concerning PT recommendation for SNF- informed about SNF and referral process.  CSW also spoke with pt dtr, [name], about the same  Employment status:  Retired Health and safety inspector:  Managed Care PT Recommendations:  Skilled Nursing Facility Information / Referral to community resources:  Skilled Nursing Facility  Patient/Family's Response to care: Pt and family agreeable to SNF.  Pt was lethargic during interview and deferred decisions to family.  Family anticipated need for rehab and would like [facility] vs [facility]  Patient/Family's Understanding of and Emotional Response to Diagnosis, Current Treatment, and Prognosis:  No questions or concerns  Emotional Assessment Appearance:  Appears stated age Attitude/Demeanor/Rapport:  Lethargic Affect  (typically observed):  Appropriate Orientation:  Oriented to Situation, Oriented to Place, Oriented to Self Alcohol / Substance use:  Not Applicable Psych involvement (Current and /or in the community):  No (Comment)  Discharge Needs  Concerns to be addressed:  Care Coordination Readmission within the last 30 days:  No Current discharge risk:  Physical Impairment Barriers to Discharge:  Continued Medical Work up   [employer], [place], LCSW 11/06/2015, 1:24 PM

## 2015-11-07 LAB — CBC
HEMATOCRIT: 32.6 % — AB (ref 36.0–46.0)
HEMOGLOBIN: 9.6 g/dL — AB (ref 12.0–15.0)
MCH: 25.1 pg — AB (ref 26.0–34.0)
MCHC: 29.4 g/dL — ABNORMAL LOW (ref 30.0–36.0)
MCV: 85.3 fL (ref 78.0–100.0)
Platelets: 330 10*3/uL (ref 150–400)
RBC: 3.82 MIL/uL — AB (ref 3.87–5.11)
RDW: 15.4 % (ref 11.5–15.5)
WBC: 11.8 10*3/uL — AB (ref 4.0–10.5)

## 2015-11-07 LAB — BASIC METABOLIC PANEL
Anion gap: 9 (ref 5–15)
BUN: 46 mg/dL — ABNORMAL HIGH (ref 6–20)
CHLORIDE: 94 mmol/L — AB (ref 101–111)
CO2: 36 mmol/L — AB (ref 22–32)
CREATININE: 1.36 mg/dL — AB (ref 0.44–1.00)
Calcium: 8.8 mg/dL — ABNORMAL LOW (ref 8.9–10.3)
GFR calc non Af Amer: 38 mL/min — ABNORMAL LOW (ref >60)
GFR, EST AFRICAN AMERICAN: 44 mL/min — AB (ref >60)
Glucose, Bld: 173 mg/dL — ABNORMAL HIGH (ref 65–99)
POTASSIUM: 4.3 mmol/L (ref 3.5–5.1)
SODIUM: 139 mmol/L (ref 135–145)

## 2015-11-07 LAB — GLUCOSE, CAPILLARY
GLUCOSE-CAPILLARY: 445 mg/dL — AB (ref 65–99)
GLUCOSE-CAPILLARY: 353 mg/dL — AB (ref 65–99)
Glucose-Capillary: 223 mg/dL — ABNORMAL HIGH (ref 65–99)
Glucose-Capillary: 399 mg/dL — ABNORMAL HIGH (ref 65–99)

## 2015-11-07 LAB — GLUCOSE, RANDOM: GLUCOSE: 495 mg/dL — AB (ref 65–99)

## 2015-11-07 MED ORDER — METHYLPREDNISOLONE SODIUM SUCC 125 MG IJ SOLR
60.0000 mg | Freq: Three times a day (TID) | INTRAMUSCULAR | Status: DC
  Administered 2015-11-07 – 2015-11-08 (×3): 60 mg via INTRAVENOUS
  Filled 2015-11-07 (×3): qty 2

## 2015-11-07 MED ORDER — FUROSEMIDE 20 MG PO TABS
20.0000 mg | ORAL_TABLET | Freq: Every day | ORAL | Status: DC
  Administered 2015-11-07: 20 mg via ORAL
  Filled 2015-11-07: qty 1

## 2015-11-07 MED ORDER — INSULIN ASPART 100 UNIT/ML ~~LOC~~ SOLN: 15.0000 [IU] | Freq: Once | SUBCUTANEOUS | Status: DC

## 2015-11-07 MED ORDER — INSULIN GLARGINE 100 UNIT/ML ~~LOC~~ SOLN
36.0000 [IU] | Freq: Every day | SUBCUTANEOUS | Status: DC
  Administered 2015-11-07: 36 [IU] via SUBCUTANEOUS
  Filled 2015-11-07 (×2): qty 0.36

## 2015-11-07 NOTE — Progress Notes (Signed)
[facility]                                                                              Patient Demographics  [name], is a 75 y.o. female, DOB - [birth year], ZOX:[id number]  Admit date - 11/05/2015   Admitting Physician Ozella Rocks, MD  Outpatient Primary MD for the patient is Ginette Otto, MD  LOS - 2   Chief Complaint  Patient presents with  . Shortness of Breath       Brief HPI   [name] is a 75 y.o. female with past medical history includes A. fib, asthma, recent pneumonia, diabetes, hypertension presented to ED with worsening shortness of breath and gradual onset confusion. Initial evaluation in the emergency department reveals acute respiratory distress likely related to asthma exacerbation in the setting of all pulmonary edema/pleural effusion, hyperkalemia, anemia hyperglycemia. Patient was admitted to stepdown unit with BiPAP   Assessment & Plan    Principal Problem: Acute hypoxic and hypercapnic respiratory failure in the setting of asthma exacerbation, pleural effusion.  - Patient had audible wheezing at the time of admission.  - CT chest showed groundglass nodules, recommended follow-up in 3 months.  - Continue scheduled nebs, IV Solu-Medrol, currently off BiPAP  - Continue IV Levaquin -Influenza panel negative  - Taper IV Solu-Medrol, transition to oral prednisone in a.m.  Acute encephalopathy: Resolved, likely due to #1, likely due to hypercarbia - Chest x-ray showed no acute infiltrates, UA negative for UTI  - CT head showed no acute infarct.  - ABG showed hypercarbia     Hyperkalemia. 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 creatinine 1.19 on admission - Follow I's and O's, creatinine stable, 1.3 today     Anemia. Hemoglobin 9.9 on admission. Chart review indicates hemoglobin 11.8 one month ago. Likely some chronic disease contributing. On  eliquis -FOBT, anemia panel showed iron saturation ratio is 10, ferritin 122, outpatient workup by PCP - H&H stable    Diabetes. Serum glucose 322 on admission. Anion gap 10. Home regimen includes Lantus and sliding scale - Uncontrolled, obtain hemoglobin A1c  - increase Lantus to 36 units, meal coverage added, sliding scale insulin  Atrial fibrillation - Currently rate controlled, continue Cardizem - CHADSVASC 4   Essential hypertension Continue Cardizem   Code Status: dnr   Family Communication: Discussed in detail with the patient, all imaging results, lab results explained to the patient    Disposition Plan: Transfer to the floor  Time Spent in minutes 25 minutes  Procedures  Bipap CXR  Consults   None   DVT Prophylaxis  apixaban  Medications  Scheduled Meds: . antiseptic oral rinse  7 mL Mouth Rinse BID  . apixaban  5 mg Oral BID  . atorvastatin  10 mg Oral Daily  . diltiazem  120 mg Oral Daily  . DULoxetine  60 mg Oral Daily  . feeding supplement (GLUCERNA SHAKE)  237 mL Oral TID BM  . furosemide  20 mg Oral Daily  . gabapentin  400 mg Oral TID  . insulin aspart  0-15  Units Subcutaneous TID WC  . insulin aspart  0-5 Units Subcutaneous QHS  . insulin aspart  5 Units Subcutaneous TID WC  . insulin glargine  30 Units Subcutaneous QHS  . levofloxacin (LEVAQUIN) IV  750 mg Intravenous Q48H  . loratadine  10 mg Oral Daily  . methylPREDNISolone (SOLU-MEDROL) injection  60 mg Intravenous Q8H  . sodium chloride flush  3 mL Intravenous Q12H  . sodium chloride flush  3 mL Intravenous Q12H   Continuous Infusions:  PRN Meds:.sodium chloride, acetaminophen **OR** acetaminophen, albuterol, ALPRAZolam, ipratropium-albuterol, ondansetron **OR** ondansetron (ZOFRAN) IV, sodium chloride flush   Antibiotics   Anti-infectives    Start     Dose/Rate Route Frequency Ordered Stop   11/05/15 1800  levofloxacin (LEVAQUIN) IVPB 750 mg     750 mg 100 mL/hr over 90 Minutes  Intravenous Every 48 hours 11/05/15 1733          Subjective:   [name] was seen and examined today.  Feels better, improving, sitting up in the chair, mild wheezing. No fevers or chills. Patient denies dizziness, chest pain, abdominal pain, N/V/D/C, new weakness, numbess, tingling. No acute events overnight.    Objective:   Blood pressure 116/46, pulse 83, temperature 97.9 F (36.6 C), temperature source Oral, resp. rate 19, height 5' (1.524 m), weight 82.827 kg (182 lb 9.6 oz), SpO2 95 %.  Wt Readings from Last 3 Encounters:  11/05/15 82.827 kg (182 lb 9.6 oz)  10/05/15 86.909 kg (191 lb 9.6 oz)  04/16/10 87.544 kg (193 lb)     Intake/Output Summary (Last 24 hours) at 11/07/15 1140 Last data filed at 11/07/15 0925  Gross per 24 hour  Intake    826 ml  Output   1300 ml  Net   -474 ml    Exam  General: Alert and oriented x 3, NAD  HEENT:  PERRLA, EOMI, Anicteric Sclera, mucous membranes moist.   Neck: Supple, no JVD, no masses  CVS: S1 S2 clear, RRR  Respiratory: Mild wheezing bilaterally  Abdomen: Soft, nontender, nondistended, + bowel sounds  Ext: no cyanosis clubbing or edema  Neuro: no new deficits  Skin: No rashes  Psych: Normal affect and demeanor, alert and oriented x3    Data Review   Micro Results Recent Results (from the past 240 hour(s))  MRSA PCR Screening     Status: None   Collection Time: 11/05/15  7:09 PM  Result Value Ref Range Status   MRSA by PCR NEGATIVE NEGATIVE Final    Comment:        The GeneXpert MRSA Assay (FDA approved for NASAL specimens only), is one component of a comprehensive MRSA colonization surveillance program. It is not intended to diagnose MRSA infection nor to guide or monitor treatment for MRSA infections.     Radiology Reports Dg Chest 2 View  10/24/2015  CLINICAL DATA:  Bilateral pneumonia EXAM: CHEST  2 VIEW COMPARISON:  10/10/2015 FINDINGS: The opacity neighboring but not entirely explained by the  right first costochondral junction is unchanged. There is a background of diffuse interstitial coarsening compatible with chronic bronchitis. There is no edema, consolidation, effusion, or pneumothorax. Normal heart size. No effusion or pneumothorax. IMPRESSION: Unchanged opacity at the medial right apex. Recommend chest CT to evaluate for nodule (contrast not essential). Electronically Signed   By: Marnee Spring M.D.   On: 10/24/2015 10:43   Dg Chest 2 View  10/11/2015  CLINICAL DATA:  75 year old female with history of cough and shortness of  breath. Pneumonia. EXAM: CHEST  2 VIEW COMPARISON:  Chest x-ray 10/01/2015. FINDINGS: Lung volumes are normal. In the medial aspect of the right upper lobe there is increased density, which may represent an area of airspace consolidation. No pleural effusions. No pneumothorax. No pulmonary nodule or mass noted. Pulmonary vasculature and the cardiomediastinal silhouette are within normal limits. Atherosclerotic calcifications in the thoracic aorta. IMPRESSION: 1. Potential airspace consolidation in the medial aspect of the right upper lobe concerning for pneumonia. Followup PA and lateral chest X-ray is recommended in 3-4 weeks following trial of antibiotic therapy to ensure resolution and exclude underlying malignancy. Should this fail to resolve at that time, further evaluation with contrast enhanced chest CT would be recommended to exclude malignancy. 2. Atherosclerosis. These results will be called to the ordering clinician or representative by the Radiologist Assistant, and communication documented in the PACS or zVision Dashboard. Electronically Signed   By: Trudie Reed M.D.   On: 10/11/2015 08:22   Ct Head Wo Contrast  11/05/2015  CLINICAL DATA:  Altered mental status. Lost balance and fell on Sunday. Headache. Swelling and bruising to left frontal region. EXAM: CT HEAD WITHOUT CONTRAST CT MAXILLOFACIAL WITHOUT CONTRAST TECHNIQUE: Multidetector CT imaging of  the head and maxillofacial structures were performed using the standard protocol without intravenous contrast. Multiplanar CT image reconstructions of the maxillofacial structures were also generated. COMPARISON:  09/28/2015 FINDINGS: CT HEAD FINDINGS No acute intracranial abnormality. Specifically, no hemorrhage, hydrocephalus, mass lesion, acute infarction, or significant intracranial injury. No acute calvarial abnormality. CT MAXILLOFACIAL FINDINGS Small air-fluid levels in the maxillary sinuses bilaterally. Soft tissue swelling over the left orbit. No evidence of orbital fracture. Zygomatic arches and mandible are intact. Orbital soft tissues are unremarkable. IMPRESSION: Soft tissue swelling over the left orbit. No evidence of orbital or facial fracture. Small air-fluid levels in the maxillary sinuses bilaterally felt to be related to acute sinusitis. No acute intracranial abnormality. Electronically Signed   By: Charlett Nose M.D.   On: 11/05/2015 11:56   Ct Chest Wo Contrast  10/29/2015  CLINICAL DATA:  Abnormal chest x-ray follow-up. Cough, shortness of breath and wheezing since 09/28/2015. EXAM: CT CHEST WITHOUT CONTRAST TECHNIQUE: Multidetector CT imaging of the chest was performed following the standard protocol without IV contrast. COMPARISON:  Prior chest x-rays from 01/05 and 10/13/2014 FINDINGS: Mediastinum/Nodes: No breast masses, supraclavicular or axillary lymphadenopathy. Small scattered lymph nodes are noted. The heart is normal in size. No pericardial effusion. The aorta is normal in caliber. Minimal atherosclerotic calcifications at the aortic arch. Coronary artery calcifications are noted. Scattered mediastinal and hilar lymph nodes but no mass or overt adenopathy. The esophagus is grossly normal. Lungs/Pleura: Breathing motion artifact does somewhat limit the examination. There is a the patchy predominantly perihilar pattern of ground-glass opacity along with more discrete ground-glass  nodules. There is also a small to moderate right pleural effusion with some overlying atelectasis. This is mostly some type of atypical inflammatory or infectious process and un likely pulmonary edema. Patient may require bronchoscopy or thoracentesis if antibiotic therapy is not working. I would recommend a followup CT scan in 3 months two reassess these findings. Upper abdomen: No significant findings. Musculoskeletal: No significant findings. There is evidence of prior left-sided thoracotomy with a left lower rib resection. IMPRESSION: 1. Patchy ground-glass opacity and more discrete ground-glass nodules most likely reflecting some type of atypical inflammatory or infectious process. Recommend followup noncontrast chest CT in 3 months to assure resolution. High-resolution chest CT should be considered  at that time. 2. Small scattered mediastinal and hilar lymph nodes but no mass or adenopathy. 3. Scattered aortic and coronary artery calcifications. Electronically Signed   By: Rudie Meyer M.D.   On: 10/29/2015 10:51   Dg Chest Portable 1 View  11/05/2015  CLINICAL DATA:  Altered mental status, bruising to LEFT eye, fall, diabetes mellitus, hypertension, COPD EXAM: PORTABLE CHEST 1 VIEW COMPARISON:  10/24/2015; correlation CT chest 10/29/2015 FINDINGS: Borderline enlargement of cardiac silhouette. Atherosclerotic calcification aorta. Minimal pulmonary vascular congestion. New interstitial infiltrates bilaterally asymmetrically greater on RIGHT question asymmetric edema. Associated RIGHT pleural effusion. No pneumothorax. Bones demineralized without definite fracture. IMPRESSION: Question mild asymmetric pulmonary edema and RIGHT pleural effusion. Electronically Signed   By: Ulyses Southward M.D.   On: 11/05/2015 11:06   Ct Maxillofacial Wo Cm  11/05/2015  CLINICAL DATA:  Altered mental status. Lost balance and fell on Sunday. Headache. Swelling and bruising to left frontal region. EXAM: CT HEAD WITHOUT CONTRAST  CT MAXILLOFACIAL WITHOUT CONTRAST TECHNIQUE: Multidetector CT imaging of the head and maxillofacial structures were performed using the standard protocol without intravenous contrast. Multiplanar CT image reconstructions of the maxillofacial structures were also generated. COMPARISON:  09/28/2015 FINDINGS: CT HEAD FINDINGS No acute intracranial abnormality. Specifically, no hemorrhage, hydrocephalus, mass lesion, acute infarction, or significant intracranial injury. No acute calvarial abnormality. CT MAXILLOFACIAL FINDINGS Small air-fluid levels in the maxillary sinuses bilaterally. Soft tissue swelling over the left orbit. No evidence of orbital fracture. Zygomatic arches and mandible are intact. Orbital soft tissues are unremarkable. IMPRESSION: Soft tissue swelling over the left orbit. No evidence of orbital or facial fracture. Small air-fluid levels in the maxillary sinuses bilaterally felt to be related to acute sinusitis. No acute intracranial abnormality. Electronically Signed   By: Charlett Nose M.D.   On: 11/05/2015 11:56    CBC  Recent Labs Lab 11/05/15 1113 11/05/15 1819 11/06/15 0241 11/07/15 0300  WBC 7.9  --  5.7 11.8*  HGB 9.9*  --  8.9* 9.6*  HCT 35.2* 30.6* 30.0* 32.6*  PLT 382  --  333 330  MCV 87.3  --  86.2 85.3  MCH 24.6*  --  25.6* 25.1*  MCHC 28.1*  --  29.7* 29.4*  RDW 15.4  --  15.6* 15.4  LYMPHSABS 0.5*  --   --   --   MONOABS 0.4  --   --   --   EOSABS 0.0  --   --   --   BASOSABS 0.0  --   --   --     Chemistries   Recent Labs Lab 11/05/15 1113 11/06/15 0241 11/06/15 0728 11/07/15 0300  NA 138 140 137 139  K 6.0* 5.3* 5.2* 4.3  CL 94* 98* 93* 94*  CO2 34* 34* 34* 36*  GLUCOSE 322* 236* 341* 173*  BUN 34* 32* 35* 46*  CREATININE 1.19* 1.13* 1.44* 1.36*  CALCIUM 9.3 9.0 9.0 8.8*  AST 17  --   --   --   ALT 18  --   --   --   ALKPHOS 90  --   --   --   BILITOT 0.5  --   --   --     ------------------------------------------------------------------------------------------------------------------ estimated creatinine clearance is 35.1 mL/min (by C-G formula based on Cr of 1.36). ------------------------------------------------------------------------------------------------------------------ No results for input(s): HGBA1C in the last 72 hours. ------------------------------------------------------------------------------------------------------------------ No results for input(s): CHOL, HDL, LDLCALC, TRIG, CHOLHDL, LDLDIRECT in the last 72 hours. ------------------------------------------------------------------------------------------------------------------  Recent Labs  11/05/15 1553  TSH 2.015   ------------------------------------------------------------------------------------------------------------------  Recent Labs  11/05/15 1553  VITAMINB12 608  FOLATE 15.0  FERRITIN 122  TIBC 239*  IRON 25*  RETICCTPCT 2.2    Coagulation profile No results for input(s): INR, PROTIME in the last 168 hours.  No results for input(s): DDIMER in the last 72 hours.  Cardiac Enzymes  Recent Labs Lab 11/05/15 1113  TROPONINI <0.03   ------------------------------------------------------------------------------------------------------------------ Invalid input(s): POCBNP   Recent Labs  11/05/15 2127 11/06/15 0750 11/06/15 1213 11/06/15 1645 11/06/15 2206 11/07/15 0741  GLUCAP 190* 302* 331* 265* 267* 223*     Maclovio Henson M.D. [facility] 11/07/2015, 11:40 AM  Pager: [phone] Between 7am to 7pm - call Pager - [phone]  After 7pm go to [email or web address] - password [email or web address]  Call night coverage person covering after 7pm

## 2015-11-07 NOTE — Clinical Documentation Improvement (Signed)
Internal Medicine  Can the diagnosis of Atrial Fibrillation be further specified? Document findings in next progress note NOT in BPA drop down box. Thanks!   Chronic Atrial fibrillation  Paroxysmal Atrial fibrillation  Permanent Atrial fibrillation  Persistent Atrial fibrillation  Other  Clinically Undetermined  Document any associated diagnoses/conditions.  Supporting Information:  Please exercise your independent, professional judgment when responding. A specific answer is not anticipated or expected.  Thank You,  Shellee Milo RN, BSN, CCDS Health Information Management [place] [phone]; Cell: [phone]

## 2015-11-07 NOTE — Progress Notes (Signed)
CRITICAL VALUE ALERT  Critical value received: CBG=445  Date of notification:11/07/2015   Time of notification:  1828  Critical value read back:yes  Nurse who received alert: Gerald Dexter, RN  MD notified (1st page):  Dr. Isidoro Donning  Time of first page:  1826  MD notified (2nd page):Dr. Rai  Time of second page:1828  Responding MD: Dr. Isidoro Donning  Time MD responded:  [phone]

## 2015-11-07 NOTE — Progress Notes (Signed)
CSW spoke with admissions from both [facility] and [facility]. Both are top choices for patient. Awaiting medical stability and will assist with d/c pending bed availability.  Lorri Frederick. Jaci Lazier, [place] [phone]

## 2015-11-07 NOTE — Progress Notes (Signed)
Inpatient Diabetes Program Recommendations  AACE/ADA: New Consensus Statement on Inpatient Glycemic Control (2015)  Target Ranges:  Prepandial:   less than 140 mg/dL      Peak postprandial:   less than 180 mg/dL (1-2 hours)      Critically ill patients:  140 - 180 mg/dL   Results for [name], [name] (MRN [id number]) as of 11/07/2015 12:30  Ref. Range 11/06/2015 07:50 11/06/2015 12:13 11/06/2015 16:45 11/06/2015 22:06  Glucose-Capillary Latest Ref Range: 65-99 mg/dL 409 (H) 811 (H) 914 (H) 267 (H)   Results for [name], [name] (MRN [id number]) as of 11/07/2015 12:30  Ref. Range 11/07/2015 07:41  Glucose-Capillary Latest Ref Range: 65-99 mg/dL 086 (H)    Home DM Meds: Toujeo insulin- 36 units daily        Humalog 7-12 units tid        Metformin 1000 mg bid  Current Insulin Orders: Lantus 36 units QHS      Novolog Moderate SSI (0-15 units) TID AC + HS      Novolog 5 units tidwc     -Note Lantus increased to 36 units QHS tonight.  -Patient having elevated postprandial glucose levels as well.     MD- If patient continues to have elevated postprandial glucose levels, please consider increasing Novolog Meal Coverage to 8 units tidwc      --Will follow patient during hospitalization--  Ambrose Finland RN, MSN, CDE Diabetes Coordinator Inpatient Glycemic Control Team Team Pager: [phone] (8a-5p)

## 2015-11-08 LAB — RESPIRATORY VIRUS PANEL
ADENOVIRUS: NEGATIVE
INFLUENZA A: NEGATIVE
INFLUENZA B 1: NEGATIVE
Metapneumovirus: NEGATIVE
Parainfluenza 1: NEGATIVE
Parainfluenza 2: NEGATIVE
Parainfluenza 3: NEGATIVE
RESPIRATORY SYNCYTIAL VIRUS A: NEGATIVE
RESPIRATORY SYNCYTIAL VIRUS B: NEGATIVE
Rhinovirus: NEGATIVE

## 2015-11-08 LAB — BASIC METABOLIC PANEL
Anion gap: 9 (ref 5–15)
BUN: 61 mg/dL — AB (ref 6–20)
CO2: 38 mmol/L — AB (ref 22–32)
Calcium: 8.7 mg/dL — ABNORMAL LOW (ref 8.9–10.3)
Chloride: 90 mmol/L — ABNORMAL LOW (ref 101–111)
Creatinine, Ser: 1.62 mg/dL — ABNORMAL HIGH (ref 0.44–1.00)
GFR calc Af Amer: 35 mL/min — ABNORMAL LOW (ref >60)
GFR, EST NON AFRICAN AMERICAN: 30 mL/min — AB (ref >60)
GLUCOSE: 352 mg/dL — AB (ref 65–99)
POTASSIUM: 4.2 mmol/L (ref 3.5–5.1)
Sodium: 137 mmol/L (ref 135–145)

## 2015-11-08 LAB — CBC
HEMATOCRIT: 32.4 % — AB (ref 36.0–46.0)
HEMOGLOBIN: 9.5 g/dL — AB (ref 12.0–15.0)
MCH: 25 pg — AB (ref 26.0–34.0)
MCHC: 29.3 g/dL — AB (ref 30.0–36.0)
MCV: 85.3 fL (ref 78.0–100.0)
Platelets: 321 10*3/uL (ref 150–400)
RBC: 3.8 MIL/uL — ABNORMAL LOW (ref 3.87–5.11)
RDW: 15.4 % (ref 11.5–15.5)
WBC: 11.7 10*3/uL — ABNORMAL HIGH (ref 4.0–10.5)

## 2015-11-08 LAB — GLUCOSE, CAPILLARY
Glucose-Capillary: 287 mg/dL — ABNORMAL HIGH (ref 65–99)
Glucose-Capillary: 287 mg/dL — ABNORMAL HIGH (ref 65–99)

## 2015-11-08 MED ORDER — INSULIN ASPART 100 UNIT/ML ~~LOC~~ SOLN
10.0000 [IU] | Freq: Three times a day (TID) | SUBCUTANEOUS | Status: DC
  Administered 2015-11-08 (×2): 10 [IU] via SUBCUTANEOUS

## 2015-11-08 MED ORDER — PREDNISONE 10 MG PO TABS: ORAL_TABLET | ORAL | Status: AC

## 2015-11-08 MED ORDER — ALPRAZOLAM 0.25 MG PO TABS: 0.2500 mg | ORAL_TABLET | Freq: Two times a day (BID) | ORAL | Status: AC | PRN

## 2015-11-08 MED ORDER — IPRATROPIUM-ALBUTEROL 0.5-2.5 (3) MG/3ML IN SOLN: 3.0000 mL | RESPIRATORY_TRACT | Status: AC | PRN

## 2015-11-08 MED ORDER — INSULIN LISPRO 100 UNIT/ML ~~LOC~~ SOLN: 10.0000 [IU] | Freq: Three times a day (TID) | SUBCUTANEOUS | Status: AC

## 2015-11-08 MED ORDER — PREDNISONE 20 MG PO TABS
40.0000 mg | ORAL_TABLET | Freq: Every day | ORAL | Status: DC
  Administered 2015-11-08: 40 mg via ORAL
  Filled 2015-11-08: qty 2

## 2015-11-08 MED ORDER — LEVOFLOXACIN 750 MG PO TABS: 750.0000 mg | ORAL_TABLET | ORAL | Status: AC

## 2015-11-08 MED ORDER — INSULIN GLARGINE 100 UNIT/ML ~~LOC~~ SOLN
40.0000 [IU] | Freq: Every day | SUBCUTANEOUS | Status: DC
  Filled 2015-11-08: qty 0.4

## 2015-11-08 MED ORDER — INSULIN GLARGINE 300 UNIT/ML ~~LOC~~ SOPN: 40.0000 [IU] | PEN_INJECTOR | Freq: Every day | SUBCUTANEOUS | Status: AC

## 2015-11-08 MED ORDER — INSULIN ASPART 100 UNIT/ML ~~LOC~~ SOLN: SUBCUTANEOUS | Status: AC

## 2015-11-08 MED ORDER — LEVOFLOXACIN 750 MG PO TABS: 750.0000 mg | ORAL_TABLET | ORAL | Status: DC

## 2015-11-08 NOTE — Discharge Summary (Signed)
Pt got discharged to [facility], Iv taken off, and tele-monitor DC, discharge package faxed to the [facility] by social worker, report provided to the RN of the [facility], pt left the unit with her husband with all of her belongings and [facility], pt's husband gave her a ride to nursing home.

## 2015-11-08 NOTE — Progress Notes (Addendum)
Patient will discharge to [facility]  Anticipated discharge date:2/3 Family notified: spouse at bedside Transportation by spouse Report [phone]  CSW signing off.  Merlyn Lot, LCSWA Clinical Social Worker [phone]

## 2015-11-08 NOTE — Progress Notes (Signed)
Physical Therapy Treatment Patient Details Name: [name] MRN: [id number] DOB: 01/13/1941 Today's Date: 11/08/2015    History of Present Illness 75 yo female admitted with respiratory acidosis with hypercarbia. PT currently has acute encephalopathy / fall. PMH COPD, DM, recent PNA, afib, HTN    PT Comments    Pt had a lower O2 sat with activity, and was propped up in bed eating her sandwich when PT arrived.  Her sat was 72% on bedside, drifted as high as 81%.  On clinical unit was 66%.  Notified nursing who reported in bed at rest sat was 95%.  Will follow her in PT if she stays over to the weekend.  Discharge is tentatively planned to SNF today.   Follow Up Recommendations  SNF;Supervision/Assistance - 24 hour     Equipment Recommendations  None recommended by PT (await snf to decide)    Recommendations for Other Services       Precautions / Restrictions Precautions Precautions: Fall Precaution Comments: oxygen ( currently 4L) Restrictions Weight Bearing Restrictions: No    Mobility  Bed Mobility Overal bed mobility: Needs Assistance Bed Mobility: Supine to Sit;Sit to Supine     Supine to sit: Min assist Sit to supine: Min assist   General bed mobility comments: was able to use bed rail to assist herself but helped back to bed wiht her LE's  Transfers Overall transfer level: Needs assistance Equipment used: Rolling walker (2 wheeled)                Ambulation/Gait             General Gait Details: Withheld due to her O2 sats   Stairs            Wheelchair Mobility    Modified Rankin (Stroke Patients Only)       Balance     Sitting balance-Leahy Scale: Good                              Cognition Arousal/Alertness: Awake/alert Behavior During Therapy: WFL for tasks assessed/performed Overall Cognitive Status: Within Functional Limits for tasks assessed                      Exercises      General Comments  General comments (skin integrity, edema, etc.): bruising on eyes      Pertinent Vitals/Pain Pain Assessment: No/denies pain    Home Living                      Prior Function            PT Goals (current goals can now be found in the care plan section) Acute Rehab PT Goals Patient Stated Goal: to get better and go home Progress towards PT goals: Not progressing toward goals - comment (O2 sats are low today)    Frequency  Min 3X/week    PT Plan Current plan remains appropriate    Co-evaluation             End of Session Equipment Utilized During Treatment: Oxygen Activity Tolerance: Patient limited by fatigue Patient left: in bed;with call bell/phone within reach;with bed alarm set;with family/visitor present     Time: 4098-1191 PT Time Calculation (min) (ACUTE ONLY): 27 min  Charges:  $Therapeutic Activity: 23-37 mins  G CodesIvar Drape 11/08/2015, 12:02 PM   Samul Dada, PT MS Acute Rehab Dept. Number: [facility] [id number] and [facility] [phone]

## 2015-11-08 NOTE — Discharge Summary (Signed)
Physician Discharge Summary   Patient ID: AJIAH MCGLINN MRN: [id number] DOB/AGE: Feb 02, 1941 75 y.o.  Admit date: 11/05/2015 Discharge date: 11/08/2015  Primary Care Physician:  Ginette Otto, MD  Discharge Diagnoses:   . Acute hypoxic and hypercapnic respiratory failure . Acute kidney injury superimposed on chronic kidney disease (HCC) . Anxiety disorder . Asthma . Diabetes mellitus, insulin dependent (IDDM), uncontrolled (HCC) . HTN (hypertension) . Pleural effusion . Anemia . Hyperkalemia . Acute encephalopathy resolved  Consults:  None  Recommendations for Outpatient Follow-up:  1. PT evaluation recommended skilled nursing facility 2. Please adjust insulin regimen with the steroids being tapered 3. Please follow CBC, BMET on Monday 4. Please follow CT chest in 3 months   DIET: Carb modified diet    Allergies:   Allergies  Allergen Reactions  . Etodolac Anaphylaxis  . Xarelto [Rivaroxaban] Itching     DISCHARGE MEDICATIONS: Current Discharge Medication List    START taking these medications   Details  insulin aspart (NOVOLOG) 100 UNIT/ML injection Correction factor Sliding scale CBG 70 - 120: 0 units CBG 121 - 150: 1 unit,  CBG 151 - 200: 2 units,  CBG 201 - 250: 3 units,  CBG 251 - 300: 5 units,  CBG 301 - 350: 7 units,  CBG 351 - 400: 9 units   CBG > 400: 9 units and notify your MD Qty: 10 mL, Refills: 11    ipratropium-albuterol (DUONEB) 0.5-2.5 (3) MG/3ML SOLN Take 3 mLs by nebulization every 4 (four) hours as needed. Qty: 360 mL    levofloxacin (LEVAQUIN) 750 MG tablet Take 1 tablet (750 mg total) by mouth every other day. X 5 doses    predniSONE (DELTASONE) 10 MG tablet Prednisone dosing: Take  Prednisone 40mg  (4 tabs) x 2 days, then taper to 30mg  (3 tabs) x 3 days, then 20mg  (2 tabs) x 3days, then 10mg  (1 tab) x 3days, then OFF.      CONTINUE these medications which have CHANGED   Details  ALPRAZolam (XANAX) 0.25 MG tablet Take 1 tablet (0.25  mg total) by mouth 2 (two) times daily as needed for anxiety or sleep. Qty: 30 tablet, Refills: 0    Insulin Glargine (TOUJEO SOLOSTAR) 300 UNIT/ML SOPN Inject 40 Units into the skin daily.    insulin lispro (HUMALOG) 100 UNIT/ML injection Inject 0.1 mLs (10 Units total) into the skin 3 (three) times daily with meals. Qty: 10 mL, Refills: 11      CONTINUE these medications which have NOT CHANGED   Details  apixaban (ELIQUIS) 5 MG TABS tablet Take 5 mg by mouth 2 (two) times daily.    atorvastatin (LIPITOR) 10 MG tablet Take 10 mg by mouth daily.    cetirizine (ZYRTEC) 10 MG tablet Take 10 mg by mouth daily as needed for allergies.    diltiazem (CARDIZEM CD) 120 MG 24 hr capsule Take 1 capsule (120 mg total) by mouth daily. Qty: 30 capsule, Refills: 1    DULoxetine (CYMBALTA) 60 MG capsule Take 60 mg by mouth daily. Refills: 5    gabapentin (NEURONTIN) 400 MG capsule Take 400 mg by mouth 3 (three) times daily as needed. Refills: 2    metFORMIN (GLUCOPHAGE) 500 MG tablet Take 1,000 mg by mouth 2 (two) times daily. Refills: 0    PROAIR HFA 108 (90 BASE) MCG/ACT inhaler Inhale 2 puffs into the lungs every 4 (four) hours as needed for wheezing.  Refills: 1      STOP taking these medications  predniSONE (STERAPRED UNI-PAK 21 TAB) 10 MG (21) TBPK tablet      Rivaroxaban (XARELTO) 15 MG TABS tablet          Brief H and P: For complete details please refer to admission H and P, but in brief  VORA CLOVER is a 75 y.o. female with past medical history includes A. fib, asthma, recent pneumonia, diabetes, hypertension presented to ED with worsening shortness of breath and gradual onset confusion. Initial evaluation in the emergency department reveals acute respiratory distress likely related to asthma exacerbation in the setting of all pulmonary edema/pleural effusion, hyperkalemia, anemia hyperglycemia. Patient was admitted to stepdown unit with BiPAP  Hospital Course:  Acute  hypoxic and hypercapnic respiratory failure in the setting of asthma exacerbation, pleural effusion.  Patient had audible wheezing at the time of admission.  - CT chest showed groundglass nodules, recommended follow-up in 3 months. The patient was placed on BiPAP in stepdown unit. She was also placed on scheduled nebulizer treatments, IV Solu-Medrol and IV Levaquin. Patient had progressive improvement and due to hyperglycemia, Solu-Medrol was tapered and transitioned to oral prednisone. She has been off BiPAP. O2 sats 95% on 4 L. Continue oral Levaquin to complete 5 more doses, prednisone taper.  Acute encephalopathy: Resolved, likely due to #1, likely due to hypercarbia - Chest x-ray showed no acute infiltrates, UA negative for UTI  - CT head showed no acute infarct.  - ABG showed hypercarbia    Hyperkalemia. 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 - Patient also received IV Lasix for possible volume overload, creatinine 1.6 at the time of discharge. Please follow BMET   Anemia. Hemoglobin 9.9 on admission. Chart review indicates hemoglobin 11.8 one month ago. Likely some chronic disease contributing. On eliquis -FOBT, anemia panel showed iron saturation ratio is 10, ferritin 122, outpatient workup by PCP - H&H stable   Diabetes uncontrolled with hyperglycemia, likely worsened due to steroids. Serum glucose 322 on admission. Anion gap 10. Home regimen includes Lantus and sliding scale - Hemoglobin A1c was 7.9 on 09/29/15 - Lantus increased to 40 units with meal coverage 10 units TID, and correction factor. Please adjust insulin regimen while steroids are being tapered.  Atrial fibrillation - Currently rate controlled, continue Cardizem - CHADSVASC 4   Essential hypertension Continue Cardizem  Day of Discharge BP 133/58 mmHg  Pulse 97  Temp(Src) 98 F (36.7 C) (Oral)  Resp 20  Ht 5' (1.524 m)  Wt  85.911 kg (189 lb 6.4 oz)  BMI 36.99 kg/m2  SpO2 95%  Physical Exam: General: Alert and awake oriented x3 not in any acute distress. HEENT: anicteric sclera, pupils reactive to light and accommodation CVS: S1-S2 clear no murmur rubs or gallops Chest: clear to auscultation bilaterally, no wheezing rales or rhonchi Abdomen: soft nontender, nondistended, normal bowel sounds Extremities: no cyanosis, clubbing or edema noted bilaterally Neuro: Cranial nerves II-XII intact, no focal neurological deficits   The results of significant diagnostics from this hospitalization (including imaging, microbiology, ancillary and laboratory) are listed below for reference.    LAB RESULTS: Basic Metabolic Panel:  Recent Labs Lab 11/07/15 0300 11/07/15 1715 11/08/15 0400  NA 139  --  137  K 4.3  --  4.2  CL 94*  --  90*  CO2 36*  --  38*  GLUCOSE 173* 495* 352*  BUN 46*  --  61*  CREATININE 1.36*  --  1.62*  CALCIUM 8.8*  --  8.7*   Liver Function Tests:  Recent Labs Lab 11/05/15 1113  AST 17  ALT 18  ALKPHOS 90  BILITOT 0.5  PROT 7.3  ALBUMIN 3.0*   No results for input(s): LIPASE, AMYLASE in the last 168 hours. No results for input(s): AMMONIA in the last 168 hours. CBC:  Recent Labs Lab 11/05/15 1113  11/07/15 0300 11/08/15 0400  WBC 7.9  < > 11.8* 11.7*  NEUTROABS 6.9  --   --   --   HGB 9.9*  < > 9.6* 9.5*  HCT 35.2*  < > 32.6* 32.4*  MCV 87.3  < > 85.3 85.3  PLT 382  < > 330 321  < > = values in this interval not displayed. Cardiac Enzymes:  Recent Labs Lab 11/05/15 1113  TROPONINI <0.03   BNP: Invalid input(s): POCBNP CBG:  Recent Labs Lab 11/07/15 2135 11/08/15 0551  GLUCAP 353* 287*    Significant Diagnostic Studies:  Ct Head Wo Contrast  11/05/2015  CLINICAL DATA:  Altered mental status. Lost balance and fell on Sunday. Headache. Swelling and bruising to left frontal region. EXAM: CT HEAD WITHOUT CONTRAST CT MAXILLOFACIAL WITHOUT CONTRAST  TECHNIQUE: Multidetector CT imaging of the head and maxillofacial structures were performed using the standard protocol without intravenous contrast. Multiplanar CT image reconstructions of the maxillofacial structures were also generated. COMPARISON:  09/28/2015 FINDINGS: CT HEAD FINDINGS No acute intracranial abnormality. Specifically, no hemorrhage, hydrocephalus, mass lesion, acute infarction, or significant intracranial injury. No acute calvarial abnormality. CT MAXILLOFACIAL FINDINGS Small air-fluid levels in the maxillary sinuses bilaterally. Soft tissue swelling over the left orbit. No evidence of orbital fracture. Zygomatic arches and mandible are intact. Orbital soft tissues are unremarkable. IMPRESSION: Soft tissue swelling over the left orbit. No evidence of orbital or facial fracture. Small air-fluid levels in the maxillary sinuses bilaterally felt to be related to acute sinusitis. No acute intracranial abnormality. Electronically Signed   By: Charlett Nose M.D.   On: 11/05/2015 11:56   Dg Chest Portable 1 View  11/05/2015  CLINICAL DATA:  Altered mental status, bruising to LEFT eye, fall, diabetes mellitus, hypertension, COPD EXAM: PORTABLE CHEST 1 VIEW COMPARISON:  10/24/2015; correlation CT chest 10/29/2015 FINDINGS: Borderline enlargement of cardiac silhouette. Atherosclerotic calcification aorta. Minimal pulmonary vascular congestion. New interstitial infiltrates bilaterally asymmetrically greater on RIGHT question asymmetric edema. Associated RIGHT pleural effusion. No pneumothorax. Bones demineralized without definite fracture. IMPRESSION: Question mild asymmetric pulmonary edema and RIGHT pleural effusion. Electronically Signed   By: Ulyses Southward M.D.   On: 11/05/2015 11:06   Ct Maxillofacial Wo Cm  11/05/2015  CLINICAL DATA:  Altered mental status. Lost balance and fell on Sunday. Headache. Swelling and bruising to left frontal region. EXAM: CT HEAD WITHOUT CONTRAST CT MAXILLOFACIAL WITHOUT  CONTRAST TECHNIQUE: Multidetector CT imaging of the head and maxillofacial structures were performed using the standard protocol without intravenous contrast. Multiplanar CT image reconstructions of the maxillofacial structures were also generated. COMPARISON:  09/28/2015 FINDINGS: CT HEAD FINDINGS No acute intracranial abnormality. Specifically, no hemorrhage, hydrocephalus, mass lesion, acute infarction, or significant intracranial injury. No acute calvarial abnormality. CT MAXILLOFACIAL FINDINGS Small air-fluid levels in the maxillary sinuses bilaterally. Soft tissue swelling over the left orbit. No evidence of orbital fracture. Zygomatic arches and mandible are intact. Orbital soft tissues are unremarkable. IMPRESSION: Soft tissue swelling over the left orbit. No evidence of orbital or facial fracture. Small air-fluid levels in the maxillary sinuses bilaterally felt to be related to acute sinusitis. No acute intracranial abnormality.  Electronically Signed   By: Charlett Nose M.D.   On: 11/05/2015 11:56    2D ECHO:   Disposition and Follow-up: Discharge Instructions    Diet Carb Modified    Complete by:  As directed      Increase activity slowly    Complete by:  As directed             DISPOSITION: Skilled nursing facility   DISCHARGE FOLLOW-UP Follow-up Information    Follow up with Ginette Otto, MD. Schedule an appointment as soon as possible for a visit in 2 weeks.   Specialty:  Internal Medicine   Why:  for hospital follow-up   Contact information:   [address] [place] [address] [phone]        Time spent on Discharge: 35 mins   Signed:   Iker Nuttall M.D. [facility] 11/08/2015, 11:06 AM Pager: [phone]

## 2015-11-22 ENCOUNTER — Ambulatory Visit: Payer: Medicare Other | Admitting: Interventional Cardiology

## 2016-01-13 ENCOUNTER — Ambulatory Visit
Admission: RE | Admit: 2016-01-13 | Discharge: 2016-01-13 | Disposition: A | Discharge status: Home / Self Care | Payer: Medicare Other | Source: Ambulatory Visit | Attending: Geriatric Medicine | Admitting: Geriatric Medicine

## 2016-01-13 DIAGNOSIS — R9389 Abnormal findings on diagnostic imaging of other specified body structures: Secondary | ICD-10-CM

## 2016-01-24 ENCOUNTER — Emergency Department (HOSPITAL_COMMUNITY): Payer: No Typology Code available for payment source

## 2016-01-24 ENCOUNTER — Inpatient Hospital Stay (HOSPITAL_COMMUNITY)
Admission: EM | Admit: 2016-01-24 | Discharge: 2016-03-05 | DRG: 183 | Disposition: E | Payer: No Typology Code available for payment source | Attending: Cardiothoracic Surgery | Admitting: Cardiothoracic Surgery

## 2016-01-24 ENCOUNTER — Encounter (HOSPITAL_COMMUNITY): Payer: Self-pay | Admitting: Emergency Medicine

## 2016-01-24 DIAGNOSIS — S27899D Unspecified injury of other specified intrathoracic organs, subsequent encounter: Secondary | ICD-10-CM | POA: Diagnosis not present

## 2016-01-24 DIAGNOSIS — Z7901 Long term (current) use of anticoagulants: Secondary | ICD-10-CM

## 2016-01-24 DIAGNOSIS — S32039D Unspecified fracture of third lumbar vertebra, subsequent encounter for fracture with routine healing: Secondary | ICD-10-CM | POA: Diagnosis not present

## 2016-01-24 DIAGNOSIS — S32019A Unspecified fracture of first lumbar vertebra, initial encounter for closed fracture: Secondary | ICD-10-CM | POA: Diagnosis present

## 2016-01-24 DIAGNOSIS — S2231XA Fracture of one rib, right side, initial encounter for closed fracture: Secondary | ICD-10-CM

## 2016-01-24 DIAGNOSIS — J440 Chronic obstructive pulmonary disease with acute lower respiratory infection: Secondary | ICD-10-CM | POA: Diagnosis present

## 2016-01-24 DIAGNOSIS — D62 Acute posthemorrhagic anemia: Secondary | ICD-10-CM | POA: Diagnosis present

## 2016-01-24 DIAGNOSIS — I129 Hypertensive chronic kidney disease with stage 1 through stage 4 chronic kidney disease, or unspecified chronic kidney disease: Secondary | ICD-10-CM | POA: Diagnosis present

## 2016-01-24 DIAGNOSIS — Y95 Nosocomial condition: Secondary | ICD-10-CM | POA: Diagnosis present

## 2016-01-24 DIAGNOSIS — J9601 Acute respiratory failure with hypoxia: Secondary | ICD-10-CM | POA: Diagnosis not present

## 2016-01-24 DIAGNOSIS — S32049A Unspecified fracture of fourth lumbar vertebra, initial encounter for closed fracture: Secondary | ICD-10-CM | POA: Diagnosis present

## 2016-01-24 DIAGNOSIS — N189 Chronic kidney disease, unspecified: Secondary | ICD-10-CM | POA: Diagnosis present

## 2016-01-24 DIAGNOSIS — Z833 Family history of diabetes mellitus: Secondary | ICD-10-CM | POA: Diagnosis not present

## 2016-01-24 DIAGNOSIS — S2241XA Multiple fractures of ribs, right side, initial encounter for closed fracture: Secondary | ICD-10-CM | POA: Diagnosis present

## 2016-01-24 DIAGNOSIS — I480 Paroxysmal atrial fibrillation: Secondary | ICD-10-CM | POA: Insufficient documentation

## 2016-01-24 DIAGNOSIS — Z8673 Personal history of transient ischemic attack (TIA), and cerebral infarction without residual deficits: Secondary | ICD-10-CM

## 2016-01-24 DIAGNOSIS — Z794 Long term (current) use of insulin: Secondary | ICD-10-CM

## 2016-01-24 DIAGNOSIS — E1142 Type 2 diabetes mellitus with diabetic polyneuropathy: Secondary | ICD-10-CM | POA: Insufficient documentation

## 2016-01-24 DIAGNOSIS — J9602 Acute respiratory failure with hypercapnia: Secondary | ICD-10-CM | POA: Diagnosis not present

## 2016-01-24 DIAGNOSIS — S2222XA Fracture of body of sternum, initial encounter for closed fracture: Principal | ICD-10-CM | POA: Diagnosis present

## 2016-01-24 DIAGNOSIS — R0602 Shortness of breath: Secondary | ICD-10-CM

## 2016-01-24 DIAGNOSIS — R52 Pain, unspecified: Secondary | ICD-10-CM | POA: Insufficient documentation

## 2016-01-24 DIAGNOSIS — Z515 Encounter for palliative care: Secondary | ICD-10-CM | POA: Diagnosis not present

## 2016-01-24 DIAGNOSIS — E1122 Type 2 diabetes mellitus with diabetic chronic kidney disease: Secondary | ICD-10-CM | POA: Diagnosis present

## 2016-01-24 DIAGNOSIS — J189 Pneumonia, unspecified organism: Secondary | ICD-10-CM | POA: Diagnosis not present

## 2016-01-24 DIAGNOSIS — S27892A Contusion of other specified intrathoracic organs, initial encounter: Secondary | ICD-10-CM | POA: Diagnosis present

## 2016-01-24 DIAGNOSIS — R339 Retention of urine, unspecified: Secondary | ICD-10-CM | POA: Diagnosis present

## 2016-01-24 DIAGNOSIS — Z66 Do not resuscitate: Secondary | ICD-10-CM | POA: Diagnosis present

## 2016-01-24 DIAGNOSIS — R410 Disorientation, unspecified: Secondary | ICD-10-CM | POA: Insufficient documentation

## 2016-01-24 DIAGNOSIS — Z888 Allergy status to other drugs, medicaments and biological substances status: Secondary | ICD-10-CM | POA: Diagnosis not present

## 2016-01-24 DIAGNOSIS — S32009A Unspecified fracture of unspecified lumbar vertebra, initial encounter for closed fracture: Secondary | ICD-10-CM | POA: Diagnosis present

## 2016-01-24 DIAGNOSIS — S32039A Unspecified fracture of third lumbar vertebra, initial encounter for closed fracture: Secondary | ICD-10-CM | POA: Diagnosis present

## 2016-01-24 DIAGNOSIS — S2220XA Unspecified fracture of sternum, initial encounter for closed fracture: Secondary | ICD-10-CM | POA: Diagnosis present

## 2016-01-24 DIAGNOSIS — D638 Anemia in other chronic diseases classified elsewhere: Secondary | ICD-10-CM | POA: Diagnosis present

## 2016-01-24 DIAGNOSIS — S2241XD Multiple fractures of ribs, right side, subsequent encounter for fracture with routine healing: Secondary | ICD-10-CM | POA: Diagnosis not present

## 2016-01-24 DIAGNOSIS — S32029A Unspecified fracture of second lumbar vertebra, initial encounter for closed fracture: Secondary | ICD-10-CM | POA: Diagnosis present

## 2016-01-24 DIAGNOSIS — J9690 Respiratory failure, unspecified, unspecified whether with hypoxia or hypercapnia: Secondary | ICD-10-CM

## 2016-01-24 DIAGNOSIS — I10 Essential (primary) hypertension: Secondary | ICD-10-CM | POA: Insufficient documentation

## 2016-01-24 DIAGNOSIS — R0902 Hypoxemia: Secondary | ICD-10-CM

## 2016-01-24 DIAGNOSIS — R0989 Other specified symptoms and signs involving the circulatory and respiratory systems: Secondary | ICD-10-CM

## 2016-01-24 DIAGNOSIS — R000 Tachycardia, unspecified: Secondary | ICD-10-CM | POA: Insufficient documentation

## 2016-01-24 DIAGNOSIS — Z79899 Other long term (current) drug therapy: Secondary | ICD-10-CM

## 2016-01-24 DIAGNOSIS — E871 Hypo-osmolality and hyponatremia: Secondary | ICD-10-CM | POA: Insufficient documentation

## 2016-01-24 DIAGNOSIS — R069 Unspecified abnormalities of breathing: Secondary | ICD-10-CM

## 2016-01-24 DIAGNOSIS — Z4659 Encounter for fitting and adjustment of other gastrointestinal appliance and device: Secondary | ICD-10-CM

## 2016-01-24 DIAGNOSIS — J449 Chronic obstructive pulmonary disease, unspecified: Secondary | ICD-10-CM | POA: Insufficient documentation

## 2016-01-24 DIAGNOSIS — E11649 Type 2 diabetes mellitus with hypoglycemia without coma: Secondary | ICD-10-CM | POA: Insufficient documentation

## 2016-01-24 DIAGNOSIS — R918 Other nonspecific abnormal finding of lung field: Secondary | ICD-10-CM

## 2016-01-24 LAB — CBC WITH DIFFERENTIAL/PLATELET
BASOS ABS: 0 10*3/uL (ref 0.0–0.1)
Basophils Relative: 0 %
EOS ABS: 1.5 10*3/uL — AB (ref 0.0–0.7)
Eosinophils Relative: 10 %
HCT: 35.2 % — ABNORMAL LOW (ref 36.0–46.0)
Hemoglobin: 10.9 g/dL — ABNORMAL LOW (ref 12.0–15.0)
LYMPHS ABS: 1.7 10*3/uL (ref 0.7–4.0)
Lymphocytes Relative: 11 %
MCH: 25.4 pg — ABNORMAL LOW (ref 26.0–34.0)
MCHC: 31 g/dL (ref 30.0–36.0)
MCV: 82.1 fL (ref 78.0–100.0)
MONO ABS: 0.9 10*3/uL (ref 0.1–1.0)
Monocytes Relative: 6 %
NEUTROS PCT: 73 %
Neutro Abs: 10.9 10*3/uL — ABNORMAL HIGH (ref 1.7–7.7)
PLATELETS: 258 10*3/uL (ref 150–400)
RBC: 4.29 MIL/uL (ref 3.87–5.11)
RDW: 15.6 % — AB (ref 11.5–15.5)
WBC: 15 10*3/uL — AB (ref 4.0–10.5)

## 2016-01-24 LAB — BASIC METABOLIC PANEL
ANION GAP: 13 (ref 5–15)
BUN: 39 mg/dL — ABNORMAL HIGH (ref 6–20)
CALCIUM: 9.5 mg/dL (ref 8.9–10.3)
CO2: 24 mmol/L (ref 22–32)
Chloride: 100 mmol/L — ABNORMAL LOW (ref 101–111)
Creatinine, Ser: 1.66 mg/dL — ABNORMAL HIGH (ref 0.44–1.00)
GFR calc Af Amer: 34 mL/min — ABNORMAL LOW (ref >60)
GFR, EST NON AFRICAN AMERICAN: 30 mL/min — AB (ref >60)
Glucose, Bld: 221 mg/dL — ABNORMAL HIGH (ref 65–99)
POTASSIUM: 4.6 mmol/L (ref 3.5–5.1)
SODIUM: 137 mmol/L (ref 135–145)

## 2016-01-24 LAB — CBG MONITORING, ED: Glucose-Capillary: 187 mg/dL — ABNORMAL HIGH (ref 65–99)

## 2016-01-24 LAB — GLUCOSE, CAPILLARY: Glucose-Capillary: 218 mg/dL — ABNORMAL HIGH (ref 65–99)

## 2016-01-24 MED ORDER — ROSUVASTATIN CALCIUM 10 MG PO TABS
10.0000 mg | ORAL_TABLET | Freq: Every day | ORAL | Status: DC
  Administered 2016-01-24 – 2016-01-28 (×5): 10 mg via ORAL
  Filled 2016-01-24 (×5): qty 1

## 2016-01-24 MED ORDER — LORATADINE 10 MG PO TABS
10.0000 mg | ORAL_TABLET | Freq: Every day | ORAL | Status: DC
  Administered 2016-01-25 – 2016-01-28 (×4): 10 mg via ORAL
  Filled 2016-01-24 (×5): qty 1

## 2016-01-24 MED ORDER — ACETAMINOPHEN 325 MG PO TABS
650.0000 mg | ORAL_TABLET | ORAL | Status: DC | PRN
  Administered 2016-01-25 – 2016-01-27 (×2): 650 mg via ORAL
  Filled 2016-01-24 (×2): qty 2

## 2016-01-24 MED ORDER — MELATONIN 2.5 MG PO CHEW: 2.5000 mg | CHEWABLE_TABLET | Freq: Every day | ORAL | Status: DC

## 2016-01-24 MED ORDER — SODIUM CHLORIDE 0.9 % IV SOLN
INTRAVENOUS | Status: DC
  Administered 2016-01-24: 21:00:00 via INTRAVENOUS

## 2016-01-24 MED ORDER — OXYCODONE HCL 5 MG PO TABS
10.0000 mg | ORAL_TABLET | ORAL | Status: DC | PRN
  Administered 2016-01-25: 10 mg via ORAL
  Filled 2016-01-24: qty 2

## 2016-01-24 MED ORDER — ONDANSETRON HCL 4 MG PO TABS: 4.0000 mg | ORAL_TABLET | Freq: Four times a day (QID) | ORAL | Status: DC | PRN

## 2016-01-24 MED ORDER — CETYLPYRIDINIUM CHLORIDE 0.05 % MT LIQD
7.0000 mL | Freq: Two times a day (BID) | OROMUCOSAL | Status: DC
  Administered 2016-01-24 – 2016-02-02 (×13): 7 mL via OROMUCOSAL

## 2016-01-24 MED ORDER — ALPRAZOLAM 0.25 MG PO TABS: 0.2500 mg | ORAL_TABLET | Freq: Two times a day (BID) | ORAL | Status: DC | PRN

## 2016-01-24 MED ORDER — ONDANSETRON HCL 4 MG/2ML IJ SOLN
4.0000 mg | Freq: Four times a day (QID) | INTRAMUSCULAR | Status: DC | PRN
  Administered 2016-01-27: 4 mg via INTRAVENOUS
  Filled 2016-01-24: qty 2

## 2016-01-24 MED ORDER — MORPHINE SULFATE (PF) 4 MG/ML IV SOLN
4.0000 mg | Freq: Once | INTRAVENOUS | Status: AC
  Administered 2016-01-24: 4 mg via INTRAVENOUS
  Filled 2016-01-24: qty 1

## 2016-01-24 MED ORDER — DULOXETINE HCL 60 MG PO CPEP
60.0000 mg | ORAL_CAPSULE | Freq: Every day | ORAL | Status: DC
  Administered 2016-01-25 – 2016-01-28 (×4): 60 mg via ORAL
  Filled 2016-01-24 (×5): qty 1

## 2016-01-24 MED ORDER — IPRATROPIUM-ALBUTEROL 0.5-2.5 (3) MG/3ML IN SOLN
3.0000 mL | Freq: Once | RESPIRATORY_TRACT | Status: AC
  Administered 2016-01-24: 3 mL via RESPIRATORY_TRACT
  Filled 2016-01-24: qty 3

## 2016-01-24 MED ORDER — ALBUTEROL SULFATE (2.5 MG/3ML) 0.083% IN NEBU
3.0000 mL | INHALATION_SOLUTION | RESPIRATORY_TRACT | Status: DC | PRN
  Administered 2016-01-28: 3 mL via RESPIRATORY_TRACT
  Filled 2016-01-24 (×2): qty 3

## 2016-01-24 MED ORDER — BISACODYL 10 MG RE SUPP: 10.0000 mg | Freq: Every day | RECTAL | Status: DC | PRN

## 2016-01-24 MED ORDER — GABAPENTIN 400 MG PO CAPS
400.0000 mg | ORAL_CAPSULE | Freq: Three times a day (TID) | ORAL | Status: DC | PRN
Start: 2016-01-24 — End: 2016-01-29
  Administered 2016-01-25: 400 mg via ORAL
  Filled 2016-01-24: qty 1

## 2016-01-24 MED ORDER — OXYCODONE HCL 5 MG PO TABS
5.0000 mg | ORAL_TABLET | ORAL | Status: DC | PRN
  Administered 2016-01-24 – 2016-01-25 (×3): 5 mg via ORAL
  Filled 2016-01-24 (×3): qty 1

## 2016-01-24 MED ORDER — METFORMIN HCL 500 MG PO TABS
1000.0000 mg | ORAL_TABLET | Freq: Two times a day (BID) | ORAL | Status: DC
  Administered 2016-01-25 – 2016-01-27 (×5): 1000 mg via ORAL
  Filled 2016-01-24 (×6): qty 2

## 2016-01-24 MED ORDER — PANTOPRAZOLE SODIUM 40 MG IV SOLR
40.0000 mg | Freq: Every day | INTRAVENOUS | Status: DC
  Administered 2016-01-24: 40 mg via INTRAVENOUS
  Filled 2016-01-24: qty 40

## 2016-01-24 MED ORDER — IPRATROPIUM-ALBUTEROL 0.5-2.5 (3) MG/3ML IN SOLN
3.0000 mL | RESPIRATORY_TRACT | Status: DC | PRN
  Administered 2016-01-25: 3 mL via RESPIRATORY_TRACT
  Filled 2016-01-24: qty 3

## 2016-01-24 MED ORDER — ALBUTEROL SULFATE HFA 108 (90 BASE) MCG/ACT IN AERS: 2.0000 | INHALATION_SPRAY | RESPIRATORY_TRACT | Status: DC | PRN

## 2016-01-24 MED ORDER — FUROSEMIDE 40 MG PO TABS
40.0000 mg | ORAL_TABLET | Freq: Every day | ORAL | Status: DC
  Administered 2016-01-25 – 2016-01-28 (×4): 40 mg via ORAL
  Filled 2016-01-24 (×5): qty 1

## 2016-01-24 MED ORDER — PANTOPRAZOLE SODIUM 40 MG PO TBEC
40.0000 mg | DELAYED_RELEASE_TABLET | Freq: Every day | ORAL | Status: DC
  Administered 2016-01-25 – 2016-01-26 (×2): 40 mg via ORAL
  Filled 2016-01-24 (×3): qty 1

## 2016-01-24 MED ORDER — INSULIN ASPART 100 UNIT/ML ~~LOC~~ SOLN
0.0000 [IU] | Freq: Three times a day (TID) | SUBCUTANEOUS | Status: DC
  Administered 2016-01-25: 7 [IU] via SUBCUTANEOUS
  Administered 2016-01-25: 4 [IU] via SUBCUTANEOUS
  Administered 2016-01-26 (×2): 3 [IU] via SUBCUTANEOUS
  Administered 2016-01-28: 4 [IU] via SUBCUTANEOUS
  Administered 2016-01-28: 3 [IU] via SUBCUTANEOUS
  Administered 2016-01-28: 4 [IU] via SUBCUTANEOUS
  Administered 2016-01-29: 3 [IU] via SUBCUTANEOUS
  Administered 2016-01-29: 4 [IU] via SUBCUTANEOUS
  Administered 2016-01-29: 7 [IU] via SUBCUTANEOUS
  Administered 2016-01-30: 3 [IU] via SUBCUTANEOUS
  Administered 2016-01-30: 7 [IU] via SUBCUTANEOUS

## 2016-01-24 MED ORDER — LEVOFLOXACIN 750 MG PO TABS
750.0000 mg | ORAL_TABLET | ORAL | Status: DC
  Administered 2016-01-24 – 2016-01-28 (×3): 750 mg via ORAL
  Filled 2016-01-24 (×3): qty 1

## 2016-01-24 MED ORDER — ENSURE ENLIVE PO LIQD
237.0000 mL | Freq: Two times a day (BID) | ORAL | Status: DC
  Administered 2016-01-25: 237 mL via ORAL

## 2016-01-24 MED ORDER — LORATADINE 10 MG PO TABS: 10.0000 mg | ORAL_TABLET | Freq: Every day | ORAL | Status: DC

## 2016-01-24 MED ORDER — SPIRONOLACTONE 25 MG PO TABS
25.0000 mg | ORAL_TABLET | Freq: Two times a day (BID) | ORAL | Status: DC
  Administered 2016-01-24 – 2016-01-28 (×9): 25 mg via ORAL
  Filled 2016-01-24 (×10): qty 1

## 2016-01-24 MED ORDER — ACETAZOLAMIDE 250 MG PO TABS
250.0000 mg | ORAL_TABLET | Freq: Every day | ORAL | Status: DC
Start: 2016-01-25 — End: 2016-01-29
  Administered 2016-01-25 – 2016-01-28 (×4): 250 mg via ORAL
  Filled 2016-01-24 (×6): qty 1

## 2016-01-24 MED ORDER — FUROSEMIDE 40 MG PO TABS: 40.0000 mg | ORAL_TABLET | Freq: Every day | ORAL | Status: DC

## 2016-01-24 MED ORDER — INSULIN DETEMIR 100 UNIT/ML ~~LOC~~ SOLN
20.0000 [IU] | Freq: Every day | SUBCUTANEOUS | Status: DC
  Administered 2016-01-24 – 2016-01-28 (×5): 20 [IU] via SUBCUTANEOUS
  Filled 2016-01-24 (×6): qty 0.2

## 2016-01-24 MED ORDER — DOCUSATE SODIUM 100 MG PO CAPS
100.0000 mg | ORAL_CAPSULE | Freq: Two times a day (BID) | ORAL | Status: DC
  Administered 2016-01-24 – 2016-01-28 (×9): 100 mg via ORAL
  Filled 2016-01-24 (×10): qty 1

## 2016-01-24 MED ORDER — PANTOPRAZOLE SODIUM 40 MG PO TBEC: 40.0000 mg | DELAYED_RELEASE_TABLET | Freq: Every day | ORAL | Status: DC

## 2016-01-24 MED ORDER — PANTOPRAZOLE SODIUM 40 MG IV SOLR: 40.0000 mg | Freq: Every day | INTRAVENOUS | Status: DC

## 2016-01-24 MED ORDER — HYDROMORPHONE HCL 1 MG/ML IJ SOLN
0.5000 mg | INTRAMUSCULAR | Status: DC | PRN
  Administered 2016-01-24 – 2016-01-25 (×2): 1 mg via INTRAVENOUS
  Administered 2016-01-27: 0.5 mg via INTRAVENOUS
  Filled 2016-01-24 (×3): qty 1

## 2016-01-24 MED ORDER — DILTIAZEM HCL ER COATED BEADS 120 MG PO CP24
120.0000 mg | ORAL_CAPSULE | Freq: Every day | ORAL | Status: DC
  Administered 2016-01-25 – 2016-01-28 (×4): 120 mg via ORAL
  Filled 2016-01-24 (×5): qty 1
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
## 2016-01-24 NOTE — ED Notes (Signed)
[facility] called for transport. 
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
## 2016-01-24 NOTE — ED Notes (Signed)
RN Taquita starting an IV currently

## 2016-01-24 NOTE — ED Notes (Signed)
Ortho called and soft collar applied.

## 2016-01-25 ENCOUNTER — Inpatient Hospital Stay (HOSPITAL_COMMUNITY): Payer: No Typology Code available for payment source

## 2016-01-25 LAB — CBC
HEMATOCRIT: 29.4 % — AB (ref 36.0–46.0)
HEMOGLOBIN: 8.7 g/dL — AB (ref 12.0–15.0)
MCH: 24.4 pg — ABNORMAL LOW (ref 26.0–34.0)
MCHC: 29.6 g/dL — AB (ref 30.0–36.0)
MCV: 82.4 fL (ref 78.0–100.0)
Platelets: 211 10*3/uL (ref 150–400)
RBC: 3.57 MIL/uL — ABNORMAL LOW (ref 3.87–5.11)
RDW: 15.2 % (ref 11.5–15.5)
WBC: 8.5 10*3/uL (ref 4.0–10.5)

## 2016-01-25 LAB — GLUCOSE, CAPILLARY
GLUCOSE-CAPILLARY: 168 mg/dL — AB (ref 65–99)
GLUCOSE-CAPILLARY: 120 mg/dL — AB (ref 65–99)
Glucose-Capillary: 227 mg/dL — ABNORMAL HIGH (ref 65–99)
Glucose-Capillary: 135 mg/dL — ABNORMAL HIGH (ref 65–99)

## 2016-01-25 LAB — MRSA PCR SCREENING: MRSA BY PCR: NEGATIVE

## 2016-01-25 LAB — HEMOGLOBIN A1C
Hgb A1c MFr Bld: 7.8 % — ABNORMAL HIGH (ref 4.8–5.6)
Mean Plasma Glucose: 177 mg/dL

## 2016-01-25 MED ORDER — GLUCERNA SHAKE PO LIQD
237.0000 mL | Freq: Three times a day (TID) | ORAL | Status: DC
  Administered 2016-01-25 – 2016-01-28 (×8): 237 mL via ORAL

## 2016-01-25 MED ORDER — FUROSEMIDE 10 MG/ML IJ SOLN
40.0000 mg | Freq: Two times a day (BID) | INTRAMUSCULAR | Status: AC
  Administered 2016-01-25 – 2016-01-26 (×2): 40 mg via INTRAVENOUS
  Filled 2016-01-25 (×2): qty 4

## 2016-01-25 NOTE — Progress Notes (Signed)
PT Cancellation Note  Patient Details Name: [name] MRN: [id number] DOB: 01/12/1941   Cancelled Treatment:    Reason Eval/Treat Not Completed: Pain limiting ability to participate; attempted to see pt this AM and was in too much pain to participate.  RN to medicate and informed colleagues to attempt to see if time later in the pm if pain more controlled.   Elray McgregorCynthia Hillery Zachman 01/25/2016, 6:41 PM  Sheran Lawlessyndi Mosetta Ferdinand, PT [phone] 01/25/2016
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
## 2016-01-25 NOTE — Progress Notes (Addendum)
Patient called out stating she was having chest pain, only radiating across chest.  Upon assessment patient's heart rate was a-fib 110s, no obvious rhythm changed noted.  Lung sounds with coarse crackles and expiratory wheezes. Fluids paused until further notice, MD aware. Breathing treatment given per respiratory therapist and MD notified.  STAT Chest X-Ray ordered. Will continue to monitor.  Cosigned by: Willeen Nieceebecca Darrelle Barrell, RN 3, BSN, [facility]
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
## 2016-01-26 ENCOUNTER — Inpatient Hospital Stay (HOSPITAL_COMMUNITY): Payer: No Typology Code available for payment source

## 2016-01-26 LAB — GLUCOSE, CAPILLARY
GLUCOSE-CAPILLARY: 136 mg/dL — AB (ref 65–99)
GLUCOSE-CAPILLARY: 126 mg/dL — AB (ref 65–99)
Glucose-Capillary: 70 mg/dL (ref 65–99)
Glucose-Capillary: 127 mg/dL — ABNORMAL HIGH (ref 65–99)

## 2016-01-26 LAB — CBC WITH DIFFERENTIAL/PLATELET
BASOS ABS: 0 10*3/uL (ref 0.0–0.1)
Basophils Relative: 0 %
EOS ABS: 0.6 10*3/uL (ref 0.0–0.7)
Eosinophils Relative: 6 %
HCT: 28.8 % — ABNORMAL LOW (ref 36.0–46.0)
Hemoglobin: 8.7 g/dL — ABNORMAL LOW (ref 12.0–15.0)
LYMPHS ABS: 1.7 10*3/uL (ref 0.7–4.0)
LYMPHS PCT: 18 %
MCH: 24.8 pg — AB (ref 26.0–34.0)
MCHC: 30.2 g/dL (ref 30.0–36.0)
MCV: 82.1 fL (ref 78.0–100.0)
MONO ABS: 1.2 10*3/uL — AB (ref 0.1–1.0)
Monocytes Relative: 13 %
NEUTROS ABS: 5.8 10*3/uL (ref 1.7–7.7)
Neutrophils Relative %: 63 %
PLATELETS: 208 10*3/uL (ref 150–400)
RBC: 3.51 MIL/uL — ABNORMAL LOW (ref 3.87–5.11)
RDW: 15.1 % (ref 11.5–15.5)
WBC: 9.3 10*3/uL (ref 4.0–10.5)

## 2016-01-26 MED ORDER — BETHANECHOL CHLORIDE 10 MG PO TABS
10.0000 mg | ORAL_TABLET | Freq: Three times a day (TID) | ORAL | Status: DC
  Administered 2016-01-26 – 2016-01-27 (×6): 10 mg via ORAL
  Filled 2016-01-26 (×9): qty 1

## 2016-01-26 MED ORDER — OXYCODONE HCL 5 MG PO TABS
2.5000 mg | ORAL_TABLET | ORAL | Status: DC | PRN
  Administered 2016-01-26 (×2): 5 mg via ORAL
  Filled 2016-01-26 (×2): qty 1

## 2016-01-26 NOTE — Progress Notes (Signed)
Orthopedic Tech Progress Note Patient Details:  [name] 01/13/1941 [id number]  Patient ID: [name], female   DOB: 08/24/1941, 75 y.o.   MRN: [id number]   Saul FordyceJennifer C Sofya Moustafa 01/26/2016, 10:44 [facility] Lumbar [facility].

## 2016-01-26 NOTE — Progress Notes (Signed)
PT Cancellation Note  Patient Details Name: Marletta LorMary N Soden MRN: [id number] DOB: 07/10/1941   Cancelled Treatment:    Reason Eval/Treat Not Completed: Medical issues which prohibited therapy. Spoke with RN - no lumbar corset in room.  RN contacting ortho techs to order.  Will return later today for OOB once corset arrives.   Vena AustriaDavis, Joshus Rogan H 01/26/2016, 10:32 AM Durenda HurtSusan H. Renaldo Fiddleravis, PT, [facility] Acute Rehab Services Pager [phone]

## 2016-01-26 NOTE — Progress Notes (Signed)
Pt received 40 mg IV Lasix @ 0147; had 1 episode of incontinence and 25 cc measured urine output. Bladder scan showed volume >999 cc; paged Dr. Corliss Skainssuei. Orders received to insert Foley. Will continue to monitor closely.

## 2016-01-26 NOTE — Progress Notes (Addendum)
14 Fr foley placed per MD order/protocol d/t acute urinary retention. Pt tolerated procedure well. Theadora RamaAmanda Gourley, NT present as second person during insertion. Peri care care with soap and water provided prior to insertion and sterile procedure maintained throughout. Will continue to monitor.
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
## 2016-01-27 DIAGNOSIS — S32009A Unspecified fracture of unspecified lumbar vertebra, initial encounter for closed fracture: Secondary | ICD-10-CM | POA: Diagnosis present

## 2016-01-27 DIAGNOSIS — D62 Acute posthemorrhagic anemia: Secondary | ICD-10-CM | POA: Clinically undetermined

## 2016-01-27 DIAGNOSIS — S2241XA Multiple fractures of ribs, right side, initial encounter for closed fracture: Secondary | ICD-10-CM | POA: Diagnosis present

## 2016-01-27 DIAGNOSIS — S32039A Unspecified fracture of third lumbar vertebra, initial encounter for closed fracture: Secondary | ICD-10-CM | POA: Diagnosis present

## 2016-01-27 LAB — GLUCOSE, CAPILLARY
GLUCOSE-CAPILLARY: 81 mg/dL (ref 65–99)
GLUCOSE-CAPILLARY: 96 mg/dL (ref 65–99)
Glucose-Capillary: 82 mg/dL (ref 65–99)
Glucose-Capillary: 45 mg/dL — ABNORMAL LOW (ref 65–99)
Glucose-Capillary: 130 mg/dL — ABNORMAL HIGH (ref 65–99)

## 2016-01-27 MED ORDER — HYDROCODONE-ACETAMINOPHEN 5-325 MG PO TABS
0.5000 | ORAL_TABLET | ORAL | Status: DC | PRN
  Administered 2016-01-28 (×2): 2 via ORAL
  Filled 2016-01-27 (×2): qty 2

## 2016-01-27 MED ORDER — TRAMADOL HCL 50 MG PO TABS
100.0000 mg | ORAL_TABLET | Freq: Four times a day (QID) | ORAL | Status: DC
  Administered 2016-01-27 – 2016-01-28 (×6): 100 mg via ORAL
  Filled 2016-01-27 (×7): qty 2

## 2016-01-27 MED ORDER — HYDROMORPHONE HCL 1 MG/ML IJ SOLN
0.5000 mg | INTRAMUSCULAR | Status: DC | PRN
  Administered 2016-01-27 – 2016-01-30 (×4): 0.5 mg via INTRAVENOUS
  Filled 2016-01-27 (×4): qty 1

## 2016-01-27 MED ORDER — POLYETHYLENE GLYCOL 3350 17 G PO PACK
17.0000 g | PACK | Freq: Every day | ORAL | Status: DC
  Administered 2016-01-27 – 2016-01-28 (×2): 17 g via ORAL
  Filled 2016-01-27 (×2): qty 1

## 2016-01-27 MED ORDER — ENOXAPARIN SODIUM 40 MG/0.4ML ~~LOC~~ SOLN
40.0000 mg | SUBCUTANEOUS | Status: DC
  Administered 2016-01-27: 40 mg via SUBCUTANEOUS
  Filled 2016-01-27: qty 0.4

## 2016-01-27 NOTE — Clinical Documentation Improvement (Signed)
Trauma  Can the diagnosis of anemia be further specified?   Iron deficiency Anemia  Blood loss anemia, acute   Blood loss anemia, chronic   Chronic Anemia, including the suspected or known cause  Anemia of chronic disease, including the associated chronic disease state  Other  Clinically Undetermined  Document any associated diagnoses/conditions. Supporting Information: Component     Latest Ref Rng Aug 08, 2016 01/25/2016 01/26/2016            Hemoglobin     12.0 - 15.0 g/dL 41.310.9 (L) 8.7 (L) 8.7 (L)  HCT     36.0 - 46.0 % 35.2 (L) 29.4 (L) 28.8 (L)   01/26/13: Acute on chronic anemia -- Stable  Please exercise your independent, professional judgment when responding. A specific answer is not anticipated or expected. Please update your documentation within the medical record to reflect your response to this query. Thank you  Thank Barrie DunkerYou,  [place] C Ison Wichmann [employer] [place] [phone]

## 2016-01-27 NOTE — Care Management Important Message (Signed)
Important Message  Patient Details  Name: Marletta LorMary N Pensyl MRN: [id number] Date of Birth: 11/09/1940   Medicare Important Message Given:  Yes    [name], Aliviah Spain [name] 01/27/2016, 12:21 PM

## 2016-01-27 NOTE — Progress Notes (Signed)
Rehab Admissions Coordinator Note:  Patient was screened by Clois DupesBoyette, Aja Whitehair Godwin for appropriateness for an Inpatient Acute Rehab Consult per OT recommendation. Noted P.T. Recommends HH.  At this time, we are recommending Inpatient Rehab consult. [employer] would have to approve an rehab venue. Please place order if pt would like to pursue intense rehab.  Clois DupesBoyette, Asa Fath Godwin 01/27/2016, 1:59 PM  I can be reached at [phone].
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
## 2016-01-27 NOTE — Progress Notes (Signed)
[facility].  Patient prepared for transfer to new room.
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
## 2016-01-28 DIAGNOSIS — Z5189 Encounter for other specified aftercare: Secondary | ICD-10-CM

## 2016-01-28 DIAGNOSIS — S27899D Unspecified injury of other specified intrathoracic organs, subsequent encounter: Secondary | ICD-10-CM

## 2016-01-28 DIAGNOSIS — R52 Pain, unspecified: Secondary | ICD-10-CM | POA: Insufficient documentation

## 2016-01-28 DIAGNOSIS — R339 Retention of urine, unspecified: Secondary | ICD-10-CM | POA: Insufficient documentation

## 2016-01-28 DIAGNOSIS — S2220XD Unspecified fracture of sternum, subsequent encounter for fracture with routine healing: Secondary | ICD-10-CM

## 2016-01-28 DIAGNOSIS — I480 Paroxysmal atrial fibrillation: Secondary | ICD-10-CM

## 2016-01-28 DIAGNOSIS — E1142 Type 2 diabetes mellitus with diabetic polyneuropathy: Secondary | ICD-10-CM | POA: Insufficient documentation

## 2016-01-28 DIAGNOSIS — R410 Disorientation, unspecified: Secondary | ICD-10-CM | POA: Insufficient documentation

## 2016-01-28 DIAGNOSIS — E11649 Type 2 diabetes mellitus with hypoglycemia without coma: Secondary | ICD-10-CM

## 2016-01-28 DIAGNOSIS — D638 Anemia in other chronic diseases classified elsewhere: Secondary | ICD-10-CM

## 2016-01-28 DIAGNOSIS — D62 Acute posthemorrhagic anemia: Secondary | ICD-10-CM

## 2016-01-28 DIAGNOSIS — R000 Tachycardia, unspecified: Secondary | ICD-10-CM

## 2016-01-28 DIAGNOSIS — J449 Chronic obstructive pulmonary disease, unspecified: Secondary | ICD-10-CM

## 2016-01-28 DIAGNOSIS — S32039D Unspecified fracture of third lumbar vertebra, subsequent encounter for fracture with routine healing: Secondary | ICD-10-CM

## 2016-01-28 DIAGNOSIS — S2241XD Multiple fractures of ribs, right side, subsequent encounter for fracture with routine healing: Secondary | ICD-10-CM

## 2016-01-28 DIAGNOSIS — E871 Hypo-osmolality and hyponatremia: Secondary | ICD-10-CM

## 2016-01-28 DIAGNOSIS — I10 Essential (primary) hypertension: Secondary | ICD-10-CM

## 2016-01-28 LAB — BASIC METABOLIC PANEL
ANION GAP: 14 (ref 5–15)
BUN: 58 mg/dL — ABNORMAL HIGH (ref 6–20)
CALCIUM: 8.6 mg/dL — AB (ref 8.9–10.3)
CO2: 26 mmol/L (ref 22–32)
Chloride: 90 mmol/L — ABNORMAL LOW (ref 101–111)
Creatinine, Ser: 2.42 mg/dL — ABNORMAL HIGH (ref 0.44–1.00)
GFR calc Af Amer: 22 mL/min — ABNORMAL LOW (ref >60)
GFR, EST NON AFRICAN AMERICAN: 19 mL/min — AB (ref >60)
Glucose, Bld: 156 mg/dL — ABNORMAL HIGH (ref 65–99)
Potassium: 4.1 mmol/L (ref 3.5–5.1)
Sodium: 130 mmol/L — ABNORMAL LOW (ref 135–145)

## 2016-01-28 LAB — CBC
HEMATOCRIT: 27.8 % — AB (ref 36.0–46.0)
Hemoglobin: 8.4 g/dL — ABNORMAL LOW (ref 12.0–15.0)
MCH: 24.6 pg — ABNORMAL LOW (ref 26.0–34.0)
MCHC: 30.2 g/dL (ref 30.0–36.0)
MCV: 81.3 fL (ref 78.0–100.0)
PLATELETS: 254 10*3/uL (ref 150–400)
RBC: 3.42 MIL/uL — ABNORMAL LOW (ref 3.87–5.11)
RDW: 15 % (ref 11.5–15.5)
WBC: 8.7 10*3/uL (ref 4.0–10.5)

## 2016-01-28 LAB — GLUCOSE, CAPILLARY
GLUCOSE-CAPILLARY: 184 mg/dL — AB (ref 65–99)
GLUCOSE-CAPILLARY: 166 mg/dL — AB (ref 65–99)
Glucose-Capillary: 145 mg/dL — ABNORMAL HIGH (ref 65–99)
Glucose-Capillary: 186 mg/dL — ABNORMAL HIGH (ref 65–99)

## 2016-01-28 MED ORDER — SODIUM CHLORIDE 0.45 % IV SOLN: INTRAVENOUS | Status: DC

## 2016-01-28 MED ORDER — SODIUM CHLORIDE 0.9 % IV SOLN
INTRAVENOUS | Status: DC
  Administered 2016-01-28 (×2): via INTRAVENOUS
  Filled 2016-01-28: qty 1000

## 2016-01-28 MED ORDER — APIXABAN 5 MG PO TABS
5.0000 mg | ORAL_TABLET | Freq: Two times a day (BID) | ORAL | Status: DC
  Administered 2016-01-28 (×2): 5 mg via ORAL
  Filled 2016-01-28 (×3): qty 1

## 2016-01-28 MED ORDER — BETHANECHOL CHLORIDE 25 MG PO TABS
25.0000 mg | ORAL_TABLET | Freq: Four times a day (QID) | ORAL | Status: DC
  Administered 2016-01-28 (×4): 25 mg via ORAL
  Filled 2016-01-28 (×6): qty 1

## 2016-01-28 NOTE — Progress Notes (Signed)
Discontinued patient foley at 0850. Bladder scan 297. Orders received to I and O cath

## 2016-01-28 NOTE — Progress Notes (Signed)
O2Sat went down to 82% on 3L/min O2 via [place].  Did oral suction to patient due to wheezing and congestion but did little help.  Wheezing still evident.  Paged RT to check on patient.
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
## 2016-01-28 NOTE — Progress Notes (Signed)
Attempted to feed patient breakfast and patient refused.
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
## 2016-01-28 NOTE — Progress Notes (Signed)
In and out cath with 300cc urine. Patient tolerated well

## 2016-01-29 ENCOUNTER — Inpatient Hospital Stay (HOSPITAL_COMMUNITY): Payer: No Typology Code available for payment source

## 2016-01-29 LAB — BLOOD GAS, ARTERIAL
ACID-BASE EXCESS: 0.1 mmol/L (ref 0.0–2.0)
Acid-Base Excess: 1.1 mmol/L (ref 0.0–2.0)
BICARBONATE: 28 meq/L — AB (ref 20.0–24.0)
Bicarbonate: 27.4 mEq/L — ABNORMAL HIGH (ref 20.0–24.0)
DELIVERY SYSTEMS: POSITIVE
Drawn by: 252031
Drawn by: 441371
FIO2: 0.7
FIO2: 1
Mode: POSITIVE
O2 SAT: 99.2 %
O2 Saturation: 98.1 %
PATIENT TEMPERATURE: 99.6
PCO2 ART: 75.9 mmHg — AB (ref 35.0–45.0)
PEEP/CPAP: 5 cmH2O
PO2 ART: 109 mmHg — AB (ref 80.0–100.0)
PO2 ART: 173 mmHg — AB (ref 80.0–100.0)
Patient temperature: 98.7
Pressure control: 14 cmH2O
RATE: 15 resp/min
TCO2: 29.7 mmol/L (ref 0–100)
TCO2: 30.2 mmol/L (ref 0–100)
pCO2 arterial: 70.8 mmHg (ref 35.0–45.0)
pH, Arterial: 7.188 — CL (ref 7.350–7.450)
pH, Arterial: 7.222 — ABNORMAL LOW (ref 7.350–7.450)

## 2016-01-29 LAB — GLUCOSE, CAPILLARY
GLUCOSE-CAPILLARY: 205 mg/dL — AB (ref 65–99)
GLUCOSE-CAPILLARY: 79 mg/dL (ref 65–99)
Glucose-Capillary: 143 mg/dL — ABNORMAL HIGH (ref 65–99)
Glucose-Capillary: 214 mg/dL — ABNORMAL HIGH (ref 65–99)
Glucose-Capillary: 234 mg/dL — ABNORMAL HIGH (ref 65–99)

## 2016-01-29 LAB — URINE MICROSCOPIC-ADD ON

## 2016-01-29 LAB — BASIC METABOLIC PANEL
ANION GAP: 12 (ref 5–15)
BUN: 62 mg/dL — ABNORMAL HIGH (ref 6–20)
CALCIUM: 8.2 mg/dL — AB (ref 8.9–10.3)
CHLORIDE: 93 mmol/L — AB (ref 101–111)
CO2: 26 mmol/L (ref 22–32)
CREATININE: 2.25 mg/dL — AB (ref 0.44–1.00)
GFR calc non Af Amer: 20 mL/min — ABNORMAL LOW (ref >60)
GFR, EST AFRICAN AMERICAN: 24 mL/min — AB (ref >60)
Glucose, Bld: 248 mg/dL — ABNORMAL HIGH (ref 65–99)
Potassium: 4.6 mmol/L (ref 3.5–5.1)
SODIUM: 131 mmol/L — AB (ref 135–145)

## 2016-01-29 LAB — URINALYSIS, ROUTINE W REFLEX MICROSCOPIC
Glucose, UA: NEGATIVE mg/dL
Ketones, ur: NEGATIVE mg/dL
Leukocytes, UA: NEGATIVE
Nitrite: NEGATIVE
Protein, ur: NEGATIVE mg/dL
Specific Gravity, Urine: 1.018 (ref 1.005–1.030)
pH: 5 (ref 5.0–8.0)

## 2016-01-29 MED ORDER — SODIUM CHLORIDE 0.9 % IV SOLN
INTRAVENOUS | Status: DC
  Administered 2016-01-29 – 2016-02-01 (×2): via INTRAVENOUS
  Filled 2016-01-29: qty 1000

## 2016-01-29 MED ORDER — NALOXONE HCL 0.4 MG/ML IJ SOLN
0.4000 mg | Freq: Once | INTRAMUSCULAR | Status: AC
  Administered 2016-01-29: 0.4 mg via INTRAVENOUS
  Filled 2016-01-29: qty 1

## 2016-01-29 MED ORDER — LEVOFLOXACIN IN D5W 500 MG/100ML IV SOLN
500.0000 mg | INTRAVENOUS | Status: AC
  Administered 2016-01-31: 500 mg via INTRAVENOUS
  Filled 2016-01-29: qty 100

## 2016-01-29 MED ORDER — LEVOFLOXACIN IN D5W 750 MG/150ML IV SOLN
750.0000 mg | Freq: Once | INTRAVENOUS | Status: AC
  Administered 2016-01-29: 750 mg via INTRAVENOUS
  Filled 2016-01-29: qty 150

## 2016-01-29 MED ORDER — CHLORHEXIDINE GLUCONATE 0.12% ORAL RINSE (MEDLINE KIT)
15.0000 mL | Freq: Two times a day (BID) | OROMUCOSAL | Status: DC
  Administered 2016-01-29 – 2016-02-03 (×10): 15 mL via OROMUCOSAL

## 2016-01-29 MED ORDER — FUROSEMIDE 10 MG/ML IJ SOLN
INTRAMUSCULAR | Status: AC
Start: 2016-01-29 — End: 2016-01-29
  Filled 2016-01-29: qty 2

## 2016-01-29 MED ORDER — FUROSEMIDE 10 MG/ML IJ SOLN
20.0000 mg | Freq: Once | INTRAMUSCULAR | Status: AC
  Administered 2016-01-29: 20 mg via INTRAVENOUS

## 2016-01-29 MED ORDER — ANTISEPTIC ORAL RINSE SOLUTION (CORINZ)
7.0000 mL | OROMUCOSAL | Status: DC
  Administered 2016-01-29 – 2016-02-03 (×41): 7 mL via OROMUCOSAL

## 2016-01-29 NOTE — Progress Notes (Addendum)
Pt unable to follow commands; very lethargic. Unable to pull water from straw, unable to swallow meds. Will notify trauma MD.  Will continue to monitor. - Lavona MoundJamie Shalisa Mcquade,RN   PA called back, stated they would look over her PO meds to see which ones needed to be switched to another route.  Asher Muir- Jasmeet Manton,RN

## 2016-01-29 NOTE — Progress Notes (Signed)
Attempted to call Cortrak RN for tube placement. No answer. Will call again later.  Asher Muir- Naoma Boxell,RN

## 2016-01-29 NOTE — Progress Notes (Signed)
Trauma Service Note  Subjective: Patient has been struggling since yesterday PM with hypoxemia.  Now on BiPaP, 70%, sats are 99-100%  Objective: Vital signs in last 24 hours: Temp:  [96 F (35.6 C)-99.5 F (37.5 C)] 99.5 F (37.5 C) (04/26 0120) Pulse Rate:  [80-98] 82 (04/26 0600) Resp:  [12-24] 15 (04/26 0600) BP: (111-130)/(43-53) 117/52 mmHg (04/26 0600) SpO2:  [82 %-100 %] 100 % (04/26 0600) FiO2 (%):  [50 %-100 %] 80 % (04/26 0600) Last BM Date: 01/29/16  Intake/Output from previous day: 04/25 0701 - 04/26 0700 In: 980 [P.O.:480; I.V.:500] Out: 875 [Urine:875] Intake/Output this shift:    General: Will respond, somewhat lethargic  Lungs: Coarse breath sounds bilaterally.  CXR from [place] actually looks much better than her clinical examination shows.Will check ABG at about noon  Abd: Soft, benign  Extremities: No changes  Neuro: Somnolent and lethargic.  Lab Results: CBC   Recent Labs  01/26/16 0817 01/28/16 0627  WBC 9.3 8.7  HGB 8.7* 8.4*  HCT 28.8* 27.8*  PLT 208 254   BMET  Recent Labs  01/28/16 0627 01/29/16 0550  NA 130* 131*  K 4.1 4.6  CL 90* 93*  CO2 26 26  GLUCOSE 156* 248*  BUN 58* 62*  CREATININE 2.42* 2.25*  CALCIUM 8.6* 8.2*   PT/INR No results for input(s): LABPROT, INR in the last 72 hours. ABG  Recent Labs  01/29/16 0018  PHART 7.188*  HCO3 27.4*    Studies/Results: Dg Chest Port 1 View  01/29/2016  CLINICAL DATA:  Respiratory compromise EXAM: PORTABLE CHEST 1 VIEW COMPARISON:  01/26/2016 FINDINGS: Mild streaky opacity at the bases attributed atelectasis. There is no edema, consolidation, effusion, or pneumothorax. Normal heart size and mediastinal contours when accounting for rightward rotation. IMPRESSION: Mild basilar atelectasis. Electronically Signed   By: Marnee SpringJonathon  Watts M.D.   On: 01/29/2016 00:39    Anti-infectives: Anti-infectives    Start     Dose/Rate Route Frequency Ordered Stop   16-Nov-2015 1700   levofloxacin (LEVAQUIN) tablet 750 mg     750 mg Oral Every 48 hours 16-Nov-2015 1647        Assessment/Plan: s/p  NPO except meds  Questions about DNR status.  Apparently the patient wants to be intubated if necessary.  Will clarify No tube feedings for now.  LOS: 5 days   Marta LamasJames O. Gae BonWyatt, III, MD, FACS [phone] Trauma Surgeon 01/29/2016

## 2016-01-29 NOTE — Progress Notes (Addendum)
CRITICAL VALUE ALERT  Critical value received:  Arterial blood gas, pCO2 70.8  Date of notification:  01/29/16  Time of notification:  12:23  Critical value read back:Yes.    Nurse who received alert:  Lavona MoundJamie Nik Gorrell,RN   MD notified (1st page):  Trauma PA  Time of first page:  12:25  MD notified (2nd page):Dr. Lindie SpruceWyatt  Time of second page:1345  Responding MD:  Dr. Lindie SpruceWyatt  Time MD responded:  1356

## 2016-01-29 NOTE — Significant Event (Signed)
Rapid Response Event Note  Called by Nadyne Coombesim, Resp Therapist to assess this DNR pt with gurgling respirations, O2 sats 67% on 4 LNC.   and difficult to arouse.  Pt Naso tracheal suctioned by RTT for moderate frothy secretions and placed on 15 L NRB mask with sats improved to 96%.   Overview: Time Called: 0044 Arrival Time: 2310 Event Type: Respiratory  Initial Focused Assessment:  Upon arrival to room pt is obtunded, responding with eye opening and a few words to vigorous stimulation before falling back to sleep  Bilateral BS with Rhonchi  Throughout, diminished in bases  RR 18 with use of accessory muscles, labored breathing.  Soft cervical collar in place. Abd obese, soft, incontinent of stool and  disimpacted of large brown formed stool.     Interventions:  Stat Port CXR, showing basilar atelectasis.    ABG  7.18  PCO2 75.9  PO2 172  Bicarb 27.4    Lasix 20 mg IV given per Dr Magnus IvanBlackman given at 0008.  CBG 214. Placed on cardiac monitoring, rate 90, irregular rhythm.      Narcan .4 mg IV given at 0030 hrs effective with pt more awake and saying "I'm dying, I'm dying".  Reassurance given to pt and explained need for BIPAP and transfer off floor. Placed on Bipap by Fayrene FearingJames, RT and pt was transported to 2S06 as stepdown overflow at 0100.    Bedside report given by Sheilah Pigeonelso, RN  Who will also contact family.  Event Summary: Name of Physician Notified: Dr Eduardo OsierBlackiman at 0013    at    Outcome: Transferred (Comment) to 2S06 at 0100     Antwoine Zorn, [place] Slideraula K

## 2016-01-29 NOTE — Clinical Social Work Note (Signed)
Clinical Social Worker continuing to follow patient and family for support and discharge planning needs.  Patient has had a medical decline and is now on the ICU with a higher oxygen need.  CSW remains available for support and to facilitate patient discharge needs once medically stable.  Macario GoldsJesse Tillman Kazmierski, [place] [phone]

## 2016-01-29 NOTE — Progress Notes (Signed)
Called to patients room by nursing staff due to decrease in SP02. Patient very obtunded with Sp02 of 67% on 4lpm. Placed patient on 100% NRB with Sp02 increasing to 94%. Patients audible rhonchi sounds coming from airway. Patient was nasal tracheal suctioned for a moderate amount of frothy clear sputum. Rapid response called to help assist with patient.
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
## 2016-01-29 NOTE — Progress Notes (Signed)
PT Cancellation Note  Patient Details Name: Marletta LorMary N Beitz MRN: [id number] DOB: 02/03/1941   Cancelled Treatment:    Reason Eval/Treat Not Completed: Medical issues which prohibited therapy (Pt on bipap). Will try again tomorrow.   Sherril Shipman 01/29/2016, 9:22 AM [facility] [phone]

## 2016-01-29 NOTE — Progress Notes (Signed)
Rehab admissions - I am following along for potential acute inpatient rehab admission.  Noted decline in medical status and need for ICU.  I will follow progress for now.  Call me for questions.  #[phone]

## 2016-01-29 NOTE — Progress Notes (Signed)
Just spoke to patient's daughter, Marla Roendrea Jenkins about the [place] her [place] [facility] and updated her of the recent events.  Daughter who is living with his Dad will likewise update patient's husband.  Left also a phone message to patient's other daughter, Toy BakerCindy Ward about the transfer.

## 2016-01-29 NOTE — Progress Notes (Signed)
Attempted Cortrak placement x2; upon removing stylette and retracing, feeding tube became coiled in stomach. Will get KUB to check placement.

## 2016-01-29 NOTE — Progress Notes (Signed)
Cortrak RN notified of order for feeding tube.  Asher Muir- Chastidy Ranker,RN

## 2016-01-30 ENCOUNTER — Inpatient Hospital Stay (HOSPITAL_COMMUNITY): Payer: No Typology Code available for payment source

## 2016-01-30 LAB — GLUCOSE, CAPILLARY
GLUCOSE-CAPILLARY: 239 mg/dL — AB (ref 65–99)
Glucose-Capillary: 104 mg/dL — ABNORMAL HIGH (ref 65–99)
Glucose-Capillary: 149 mg/dL — ABNORMAL HIGH (ref 65–99)
Glucose-Capillary: 219 mg/dL — ABNORMAL HIGH (ref 65–99)

## 2016-01-30 LAB — BLOOD GAS, ARTERIAL
Acid-base deficit: 1.8 mmol/L (ref 0.0–2.0)
BICARBONATE: 24.5 meq/L — AB (ref 20.0–24.0)
Delivery systems: POSITIVE
Drawn by: 449561
EXPIRATORY PAP: 5
FIO2: 0.6
INSPIRATORY PAP: 14
O2 Saturation: 99.7 %
PATIENT TEMPERATURE: 98.6
PH ART: 7.249 — AB (ref 7.350–7.450)
TCO2: 26.2 mmol/L (ref 0–100)
pCO2 arterial: 57.9 mmHg (ref 35.0–45.0)
pO2, Arterial: 211 mmHg — ABNORMAL HIGH (ref 80.0–100.0)

## 2016-01-30 LAB — URINE CULTURE: CULTURE: NO GROWTH

## 2016-01-30 LAB — CBC WITH DIFFERENTIAL/PLATELET
BASOS ABS: 0 10*3/uL (ref 0.0–0.1)
Basophils Relative: 0 %
EOS PCT: 0 %
Eosinophils Absolute: 0 10*3/uL (ref 0.0–0.7)
HEMATOCRIT: 23.2 % — AB (ref 36.0–46.0)
HEMOGLOBIN: 7 g/dL — AB (ref 12.0–15.0)
LYMPHS ABS: 1.1 10*3/uL (ref 0.7–4.0)
LYMPHS PCT: 10 %
MCH: 25.5 pg — AB (ref 26.0–34.0)
MCHC: 30.2 g/dL (ref 30.0–36.0)
MCV: 84.7 fL (ref 78.0–100.0)
MONOS PCT: 13 %
Monocytes Absolute: 1.4 10*3/uL — ABNORMAL HIGH (ref 0.1–1.0)
NEUTROS ABS: 8.5 10*3/uL — AB (ref 1.7–7.7)
Neutrophils Relative %: 77 %
Platelets: 242 10*3/uL (ref 150–400)
RBC: 2.74 MIL/uL — ABNORMAL LOW (ref 3.87–5.11)
RDW: 15 % (ref 11.5–15.5)
WBC: 11 10*3/uL — ABNORMAL HIGH (ref 4.0–10.5)

## 2016-01-30 LAB — ABO/RH: ABO/RH(D): O NEG

## 2016-01-30 LAB — BASIC METABOLIC PANEL
Anion gap: 11 (ref 5–15)
BUN: 60 mg/dL — ABNORMAL HIGH (ref 6–20)
CHLORIDE: 101 mmol/L (ref 101–111)
CO2: 25 mmol/L (ref 22–32)
CREATININE: 2.06 mg/dL — AB (ref 0.44–1.00)
Calcium: 8.3 mg/dL — ABNORMAL LOW (ref 8.9–10.3)
GFR calc non Af Amer: 23 mL/min — ABNORMAL LOW (ref >60)
GFR, EST AFRICAN AMERICAN: 26 mL/min — AB (ref >60)
Glucose, Bld: 111 mg/dL — ABNORMAL HIGH (ref 65–99)
POTASSIUM: 4.5 mmol/L (ref 3.5–5.1)
SODIUM: 137 mmol/L (ref 135–145)

## 2016-01-30 LAB — PREPARE RBC (CROSSMATCH)

## 2016-01-30 MED ORDER — ACETAMINOPHEN 160 MG/5ML PO SOLN
650.0000 mg | Freq: Four times a day (QID) | ORAL | Status: DC | PRN
  Administered 2016-01-30: 650 mg
  Filled 2016-01-30: qty 20.3

## 2016-01-30 MED ORDER — MORPHINE SULFATE (PF) 2 MG/ML IV SOLN
2.0000 mg | INTRAVENOUS | Status: DC | PRN
  Administered 2016-01-30 – 2016-02-03 (×17): 2 mg via INTRAVENOUS
  Filled 2016-01-30 (×16): qty 1

## 2016-01-30 MED ORDER — TRAMADOL 5 MG/ML ORAL SUSPENSION: 50.0000 mg | Freq: Four times a day (QID) | ORAL | Status: DC | PRN

## 2016-01-30 MED ORDER — INSULIN DETEMIR 100 UNIT/ML ~~LOC~~ SOLN
10.0000 [IU] | Freq: Every day | SUBCUTANEOUS | Status: DC
  Administered 2016-01-30 – 2016-02-01 (×3): 10 [IU] via SUBCUTANEOUS
  Filled 2016-01-30 (×4): qty 0.1

## 2016-01-30 MED ORDER — PIVOT 1.5 CAL PO LIQD
1000.0000 mL | ORAL | Status: DC
  Administered 2016-01-30: 1000 mL

## 2016-01-30 MED ORDER — LORAZEPAM 2 MG/ML IJ SOLN
0.5000 mg | INTRAMUSCULAR | Status: DC | PRN
  Administered 2016-01-30 – 2016-02-03 (×6): 0.5 mg via INTRAVENOUS
  Filled 2016-01-30 (×6): qty 1

## 2016-01-30 MED ORDER — PIVOT 1.5 CAL PO LIQD
1000.0000 mL | ORAL | Status: DC
  Administered 2016-01-31 – 2016-02-03 (×4): 1000 mL

## 2016-01-30 MED ORDER — TRAMADOL HCL 50 MG PO TABS
50.0000 mg | ORAL_TABLET | Freq: Four times a day (QID) | ORAL | Status: DC | PRN
Start: 2016-01-30 — End: 2016-02-03

## 2016-01-30 NOTE — Plan of Care (Signed)
Problem: Nutrition: Goal: Adequate nutrition will be maintained Outcome: Not Progressing Patient currently NPO. Coretrak placement attempted x 2 on 4/26.
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
## 2016-01-30 NOTE — Progress Notes (Signed)
Discussed patient's increased restlessness and agitation related to discomfort and hurting. Patient's states she's dying and wants to die. Crying out. Has been given pain meds and comforted. Able to distract occasionally and she will settle down.Respiratory rate occasionally elevated to to 40-50's and then will decrease to 20-30. Orders given.

## 2016-01-30 NOTE — Care Management Important Message (Signed)
Important Message  Patient Details  Name: [name] MRN: [id number] Date of Birth: 05/16/1941   Medicare Important Message Given:  Yes    [name], [name] 01/30/2016, 1:34 PM

## 2016-01-30 NOTE — Progress Notes (Signed)
Patient ID: [name], female   DOB: 06/26/1941, [age] y.o.   MRN: [id number] I spoke with her daughter at the bedside. She expressed that Corrie DandyMary wants to be intubated if her resp satus worsens but she otherwise wants to maintain her DNR status. Violeta GelinasBurke Brodin Gelpi, MD, MPH, FACS Trauma: [phone] General Surgery: [phone]

## 2016-01-30 NOTE — Progress Notes (Signed)
Dr. Janee Mornhompson notified temp 100 degrees axillary prior to transfusion start. Ok to give.

## 2016-01-30 NOTE — Progress Notes (Signed)
PT Cancellation Note  Patient Details Name: [name] MRN: [id number] DOB: 11/04/1940   Cancelled Treatment:    Reason Eval/Treat Not Completed: Medical issues which prohibited therapy.  Spoke with RN and pt is currently on Bipap and not appropriate for PT and mobility at this time.  Will hold PT at this time and f/u as appropriate.     Sunny SchleinRitenour, Tamura Lasky F, [place] CarolinaPT [phone] 01/30/2016, 9:46 AM
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
## 2016-01-30 NOTE — Progress Notes (Signed)
OT Cancellation Note  Patient Details Name: [name] MRN: [id number] DOB: 03/28/1941   Cancelled Treatment:    Reason Eval/Treat Not Completed: Medical issues which prohibited therapy. Pt with medical decline, transferred to ICU 4/25 and now on Bipap. Will continue to follow.  Evern BioMayberry, Chelsee Hosie Lynn 01/30/2016, 8:39 AM

## 2016-01-31 ENCOUNTER — Inpatient Hospital Stay (HOSPITAL_COMMUNITY): Payer: No Typology Code available for payment source

## 2016-01-31 LAB — CBC
HEMATOCRIT: 26.3 % — AB (ref 36.0–46.0)
Hemoglobin: 7.9 g/dL — ABNORMAL LOW (ref 12.0–15.0)
MCH: 25.3 pg — ABNORMAL LOW (ref 26.0–34.0)
MCHC: 30 g/dL (ref 30.0–36.0)
MCV: 84.3 fL (ref 78.0–100.0)
PLATELETS: 227 10*3/uL (ref 150–400)
RBC: 3.12 MIL/uL — ABNORMAL LOW (ref 3.87–5.11)
RDW: 15.2 % (ref 11.5–15.5)
WBC: 8.7 10*3/uL (ref 4.0–10.5)

## 2016-01-31 LAB — GLUCOSE, CAPILLARY
GLUCOSE-CAPILLARY: 287 mg/dL — AB (ref 65–99)
GLUCOSE-CAPILLARY: 240 mg/dL — AB (ref 65–99)
Glucose-Capillary: 285 mg/dL — ABNORMAL HIGH (ref 65–99)
Glucose-Capillary: 286 mg/dL — ABNORMAL HIGH (ref 65–99)
Glucose-Capillary: 299 mg/dL — ABNORMAL HIGH (ref 65–99)
Glucose-Capillary: 313 mg/dL — ABNORMAL HIGH (ref 65–99)

## 2016-01-31 LAB — BASIC METABOLIC PANEL
ANION GAP: 8 (ref 5–15)
BUN: 51 mg/dL — ABNORMAL HIGH (ref 6–20)
CALCIUM: 8.7 mg/dL — AB (ref 8.9–10.3)
CO2: 27 mmol/L (ref 22–32)
Chloride: 106 mmol/L (ref 101–111)
Creatinine, Ser: 1.3 mg/dL — ABNORMAL HIGH (ref 0.44–1.00)
GFR, EST AFRICAN AMERICAN: 46 mL/min — AB (ref >60)
GFR, EST NON AFRICAN AMERICAN: 40 mL/min — AB (ref >60)
Glucose, Bld: 347 mg/dL — ABNORMAL HIGH (ref 65–99)
Potassium: 3.9 mmol/L (ref 3.5–5.1)
Sodium: 141 mmol/L (ref 135–145)

## 2016-01-31 LAB — TYPE AND SCREEN
ABO/RH(D): O NEG
Antibody Screen: NEGATIVE
UNIT DIVISION: 0

## 2016-01-31 MED ORDER — INSULIN ASPART 100 UNIT/ML ~~LOC~~ SOLN
0.0000 [IU] | SUBCUTANEOUS | Status: DC
  Administered 2016-01-31 (×4): 11 [IU] via SUBCUTANEOUS
  Administered 2016-01-31: 15 [IU] via SUBCUTANEOUS
  Administered 2016-02-01 (×3): 11 [IU] via SUBCUTANEOUS
  Administered 2016-02-01 (×2): 7 [IU] via SUBCUTANEOUS
  Administered 2016-02-01: 11 [IU] via SUBCUTANEOUS
  Administered 2016-02-02: 3 [IU] via SUBCUTANEOUS
  Administered 2016-02-02: 11 [IU] via SUBCUTANEOUS
  Administered 2016-02-02: 4 [IU] via SUBCUTANEOUS
  Administered 2016-02-02 (×3): 11 [IU] via SUBCUTANEOUS
  Administered 2016-02-03: 3 [IU] via SUBCUTANEOUS
  Administered 2016-02-03: 4 [IU] via SUBCUTANEOUS

## 2016-01-31 NOTE — Progress Notes (Signed)
Pt taken off bipap and placed on 6L HFNC. Pt with audible gurgling and was NT suctioned x 2 on L nare with no success in secretion removal. Suction catheter was attempted to go down back of throat to remove secretions, also without success. Pt in no distress at this time. RN aware. RT will closely monitor pt.

## 2016-01-31 NOTE — Progress Notes (Signed)
Rehab admissions - Noted patient remains on BiPap and remains in the ICU.  Note medically ready at this time.  I will follow up on Monday for progress.  Call me for questions.  #[phone]

## 2016-01-31 NOTE — Progress Notes (Signed)
Pt remains in ICU; unable to wean from BIPAP at this time.  CIR recommendation by PT/OT prior to medical decline; may need SNF if unable to tolerate therapy.  Will continue to follow progress.  CSW following if SNF needed.    Quintella BatonJulie W. Dylon Correa, RN, BSN  Trauma/Neuro ICU Case Manager [phone]

## 2016-01-31 NOTE — Progress Notes (Signed)
Trauma Service Note  Subjective: Patient still on BiPaP, FIO2 40%.  Sats are 100%  Not able to wean off BiPap  Objective: Vital signs in last 24 hours: Temp:  [97.5 F (36.4 C)-100 F (37.8 C)] 98.1 F (36.7 C) (04/28 0840) Pulse Rate:  [89-112] 97 (04/28 0800) Resp:  [15-25] 20 (04/28 0800) BP: (117-144)/(42-67) 144/67 mmHg (04/28 0800) SpO2:  [98 %-100 %] 100 % (04/28 0800) FiO2 (%):  [40 %] 40 % (04/28 0800) Weight:  [74.3 kg (163 lb 12.8 oz)] 74.3 kg (163 lb 12.8 oz) (04/28 0449) Last BM Date: 01/30/16  Intake/Output from previous day: 04/27 0701 - 04/28 0700 In: 2455.8 [I.V.:1343.8; Blood:250; NG/GT:802; IV Piggyback:60] Out: 1796 [Urine:1795; Stool:1] Intake/Output this shift: Total I/O In: 100 [I.V.:50; NG/GT:50] Out: 150 [Urine:150]  General: Patient apparently wants to be intubated if needed.  Lungs: Diminished in the bases.  No wheezes.  Abd: Soft, tolerating tube feedings well.  Extremities: No changes.  Neuro: Seems to be intact  Lab Results: CBC   Recent Labs  01/30/16 0253 01/31/16 0322  WBC 11.0* 8.7  HGB 7.0* 7.9*  HCT 23.2* 26.3*  PLT 242 227   BMET  Recent Labs  01/30/16 0253 01/31/16 0322  NA 137 141  K 4.5 3.9  CL 101 106  CO2 25 27  GLUCOSE 111* 347*  BUN 60* 51*  CREATININE 2.06* 1.30*  CALCIUM 8.3* 8.7*   PT/INR No results for input(s): LABPROT, INR in the last 72 hours. ABG  Recent Labs  01/29/16 1208 01/30/16 0621  PHART 7.222* 7.249*  HCO3 28.0* 24.5*    Studies/Results: Dg Chest Port 1 View  01/31/2016  CLINICAL DATA:  Respiratory failure EXAM: PORTABLE CHEST 1 VIEW COMPARISON:  01/30/2016 FINDINGS: Feeding catheter is again identified within the stomach. Cardiac shadow is stable. Patchy infiltrates are again seen bilaterally without significant change. No acute bony abnormality is seen. IMPRESSION: Patchy bilateral infiltrates stable from the prior exam. Electronically Signed   By: Alcide CleverMark  Lukens M.D.   On:  01/31/2016 07:37   Dg Chest Port 1 View  01/30/2016  CLINICAL DATA:  Hypoxia. EXAM: PORTABLE CHEST 1 VIEW COMPARISON:  01/29/2016. FINDINGS: Interim placement of feeding tube, its tip below left hemidiaphragm. Heart size is normal. Pulmonary vascular congestion is noted. Progressive bilateral pulmonary interstitial prominence noted suggesting congestive heart failure. Persistent right lung base infiltrate noted. Pneumonia cannot be excluded. No pleural effusion or pneumothorax. IMPRESSION: 1. Interim placement of feeding tube, its tip is below the left hemidiaphragm. 2. Interim progression of pulmonary interstitial prominence suggesting a component of congestive heart failure. Persistent right base pulmonary infiltrate. Pneumonia cannot be excluded . Electronically Signed   By: Maisie Fushomas  Register   On: 01/30/2016 07:47   Dg Abd Portable 1v  01/29/2016  CLINICAL DATA:  Encounter for feeding tube placement. EXAM: PORTABLE ABDOMEN - 1 VIEW COMPARISON:  Same day. FINDINGS: The bowel gas pattern is normal. No radio-opaque calculi or other significant radiographic abnormality are seen. Distal tip of feeding tube appears to be in distal stomach. IMPRESSION: Distal tip of feeding tube seen in distal stomach. Electronically Signed   By: Lupita RaiderJames  Green Jr, M.D.   On: 01/29/2016 17:34   Dg Abd Portable 1v  01/29/2016  CLINICAL DATA:  Check feeding tube placement EXAM: PORTABLE ABDOMEN - 1 VIEW COMPARISON:  None. FINDINGS: Scattered large and small bowel gas is noted. A feeding catheter is noted coiled within the stomach with the tip directed towards the distal stomach.  Degenerative changes of lumbar spine are seen. No obstructive changes are noted. IMPRESSION: Feeding catheter as described. Electronically Signed   By: Alcide Clever M.D.   On: 01/29/2016 14:11    Anti-infectives: Anti-infectives    Start     Dose/Rate Route Frequency Ordered Stop   01/31/16 1100  levofloxacin (LEVAQUIN) IVPB 500 mg     500 mg 100  mL/hr over 60 Minutes Intravenous Every 48 hours 01/29/16 1047     01/29/16 1100  levofloxacin (LEVAQUIN) IVPB 750 mg     750 mg 100 mL/hr over 90 Minutes Intravenous  Once 01/29/16 1046 01/29/16 1304   01/18/2016 1700  levofloxacin (LEVAQUIN) tablet 750 mg  Status:  Discontinued     750 mg Oral Every 48 hours 01/22/2016 1647 01/29/16 1046      Assessment/Plan: s/p  Keep in the ICU for now.  Will try to start weaning off the BiPAP.  Will change code status  LOS: 7 days   Marta Lamas. Gae Bon, MD, FACS [phone] Trauma Surgeon 01/31/2016

## 2016-01-31 NOTE — Progress Notes (Signed)
Inpatient Diabetes Program Recommendations  AACE/ADA: New Consensus Statement on Inpatient Glycemic Control (2015)  Target Ranges:  Prepandial:   less than 140 mg/dL      Peak postprandial:   less than 180 mg/dL (1-2 hours)      Critically ill patients:  140 - 180 mg/dL   Results for [name], [name] (MRN [id number]) as of 01/31/2016 09:03  Ref. Range 01/30/2016 08:37 01/30/2016 12:15 01/30/2016 16:17 01/30/2016 19:27  Glucose-Capillary Latest Ref Range: 65-99 mg/dL 213104 (H) 086149 (H) 578239 (H) 219 (H)   Results for [name], [name] (MRN [id number]) as of 01/31/2016 09:03  Ref. Range 01/31/2016 00:09 01/31/2016 03:55  Glucose-Capillary Latest Ref Range: 65-99 mg/dL 413286 (H) 244299 (H)    Home DM Meds: Metformin 1000 mg bid       Toujeo 20 units daily       Novolog SSI  Current Insulin Orders: Levemir 10 units QHS         Novolog Resistant Correction Scale/ SSI (0-20 units) Q4 hours      -Note patient currently getting Pivot tube feeds at 50cc/hour.  -Glucose levels have slowly increased since tube feeds were started yesterday morning.    MD- Please consider starting Novolog tube feed coverage-  Novolog 4 units Q4 hours (hold if tube feeds held for any reason)     --Will follow patient during hospitalization--  Ambrose FinlandJeannine Johnston Kahlan Engebretson RN, MSN, CDE Diabetes Coordinator Inpatient Glycemic Control Team Team Pager: [phone] (8a-5p)

## 2016-01-31 NOTE — Clinical Social Work Note (Signed)
Clinical Social Worker continuing to follow patient and family for support and discharge planning needs. Patient continues to remain in the ICU on BiPap. CSW remains available for support and to facilitate patient discharge needs once medically stable.  Macario GoldsJesse Lin Glazier, Kentucky[place]CSW [phone]

## 2016-02-01 LAB — GLUCOSE, CAPILLARY
GLUCOSE-CAPILLARY: 205 mg/dL — AB (ref 65–99)
GLUCOSE-CAPILLARY: 268 mg/dL — AB (ref 65–99)
GLUCOSE-CAPILLARY: 273 mg/dL — AB (ref 65–99)
Glucose-Capillary: 203 mg/dL — ABNORMAL HIGH (ref 65–99)
Glucose-Capillary: 223 mg/dL — ABNORMAL HIGH (ref 65–99)
Glucose-Capillary: 268 mg/dL — ABNORMAL HIGH (ref 65–99)

## 2016-02-01 LAB — CULTURE, RESPIRATORY W GRAM STAIN: Culture: NORMAL

## 2016-02-01 LAB — CULTURE, RESPIRATORY

## 2016-02-01 NOTE — Progress Notes (Signed)
Pt has remained off bipap for 17 hours with no changes in mentation, O2 sats, or work of breathing. Currently sating 99% on 6L [place], and all other vital signs stable. Will continue to monitor.
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
## 2016-02-02 ENCOUNTER — Inpatient Hospital Stay (HOSPITAL_COMMUNITY): Payer: No Typology Code available for payment source

## 2016-02-02 LAB — POCT I-STAT 3, ART BLOOD GAS (G3+)
ACID-BASE EXCESS: 6 mmol/L — AB (ref 0.0–2.0)
Bicarbonate: 33.7 mEq/L — ABNORMAL HIGH (ref 20.0–24.0)
O2 Saturation: 93 %
PO2 ART: 85 mmHg (ref 80.0–100.0)
TCO2: 36 mmol/L (ref 0–100)
pCO2 arterial: 78.2 mmHg (ref 35.0–45.0)
pH, Arterial: 7.247 — ABNORMAL LOW (ref 7.350–7.450)

## 2016-02-02 LAB — CULTURE, RESPIRATORY W GRAM STAIN

## 2016-02-02 LAB — GLUCOSE, CAPILLARY
GLUCOSE-CAPILLARY: 294 mg/dL — AB (ref 65–99)
GLUCOSE-CAPILLARY: 297 mg/dL — AB (ref 65–99)
GLUCOSE-CAPILLARY: 300 mg/dL — AB (ref 65–99)
Glucose-Capillary: 296 mg/dL — ABNORMAL HIGH (ref 65–99)
Glucose-Capillary: 148 mg/dL — ABNORMAL HIGH (ref 65–99)
Glucose-Capillary: 170 mg/dL — ABNORMAL HIGH (ref 65–99)

## 2016-02-02 LAB — CULTURE, RESPIRATORY

## 2016-02-02 MED ORDER — ACETAMINOPHEN 10 MG/ML IV SOLN
1000.0000 mg | Freq: Four times a day (QID) | INTRAVENOUS | Status: AC
  Administered 2016-02-02 – 2016-02-03 (×4): 1000 mg via INTRAVENOUS
  Filled 2016-02-02 (×4): qty 100

## 2016-02-02 MED ORDER — INSULIN DETEMIR 100 UNIT/ML ~~LOC~~ SOLN
40.0000 [IU] | Freq: Every day | SUBCUTANEOUS | Status: DC
  Administered 2016-02-02: 40 [IU] via SUBCUTANEOUS
  Filled 2016-02-02 (×2): qty 0.4

## 2016-02-02 MED ORDER — OXYCODONE HCL 5 MG/5ML PO SOLN: 5.0000 mg | ORAL | Status: DC | PRN

## 2016-02-02 NOTE — Progress Notes (Signed)
Pt continues to pull at bipap mask and disconnect tubing.  Pt has been on bipap for 6 hours.  No orders for follow up ABG.  Pt currently on 4l [place] with sats of 98%.  Pt continues to have congested cough.  RT will continue to monitor.  RN aware.
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
## 2016-02-02 NOTE — Progress Notes (Signed)
   02/02/16 2045  Vent Select  Invasive or Noninvasive Noninvasive  Adult Vent Y  Adult Ventilator Settings  Vent Type Servo i  Vent Mode BIPAP;PCV  Set Rate 15 bmp  FiO2 (%) 40 %  IPAP 13 cmH20  EPAP 5 cmH20  Pressure Control 8 cmH20  PEEP 5 cmH20  Adult Ventilator Measurements  Peak Airway Pressure 13 L/min  Mean Airway Pressure 6 cmH20  Resp Rate Spontaneous 26 br/min  Exhaled Vt 226 mL  Measured Ve 5.4 mL  SpO2 99 %  Adult Ventilator Alarms  Alarms On Y  Ve High Alarm 20 L/min  Ve Low Alarm 3 L/min  Resp Rate High Alarm 40 br/min  Resp Rate Low Alarm 5  PEEP Low Alarm 3 cmH2O  Press High Alarm 20 cmH2O  Patient placed back on BIPAP.

## 2016-02-02 NOTE — Progress Notes (Signed)
Pt placed back on bipap after ABG drawn on 4l [place].  Pt's CO2 elevated per [facility] pt to go back on bipap.  Pt is tolerating well.  RT will continue to monitor.

## 2016-02-03 LAB — CBC WITH DIFFERENTIAL/PLATELET
BASOS ABS: 0.1 10*3/uL (ref 0.0–0.1)
BASOS PCT: 1 %
EOS ABS: 0.3 10*3/uL (ref 0.0–0.7)
Eosinophils Relative: 2 %
HCT: 31 % — ABNORMAL LOW (ref 36.0–46.0)
HEMOGLOBIN: 8.7 g/dL — AB (ref 12.0–15.0)
LYMPHS PCT: 11 %
Lymphs Abs: 1.4 10*3/uL (ref 0.7–4.0)
MCH: 25.3 pg — AB (ref 26.0–34.0)
MCHC: 28.1 g/dL — ABNORMAL LOW (ref 30.0–36.0)
MCV: 90.1 fL (ref 78.0–100.0)
MONO ABS: 1.6 10*3/uL — AB (ref 0.1–1.0)
Monocytes Relative: 12 %
NEUTROS PCT: 74 %
Neutro Abs: 9.6 10*3/uL — ABNORMAL HIGH (ref 1.7–7.7)
PLATELETS: 336 10*3/uL (ref 150–400)
RBC: 3.44 MIL/uL — AB (ref 3.87–5.11)
RDW: 16.1 % — ABNORMAL HIGH (ref 11.5–15.5)
WBC: 13 10*3/uL — AB (ref 4.0–10.5)

## 2016-02-03 LAB — POCT I-STAT 3, ART BLOOD GAS (G3+)
Acid-Base Excess: 8 mmol/L — ABNORMAL HIGH (ref 0.0–2.0)
Bicarbonate: 34.3 mEq/L — ABNORMAL HIGH (ref 20.0–24.0)
O2 SAT: 82 %
PCO2 ART: 59.3 mmHg — AB (ref 35.0–45.0)
PH ART: 7.37 (ref 7.350–7.450)
Patient temperature: 98.7
TCO2: 36 mmol/L (ref 0–100)
pO2, Arterial: 50 mmHg — ABNORMAL LOW (ref 80.0–100.0)

## 2016-02-03 LAB — GLUCOSE, CAPILLARY
GLUCOSE-CAPILLARY: 103 mg/dL — AB (ref 65–99)
Glucose-Capillary: 120 mg/dL — ABNORMAL HIGH (ref 65–99)
Glucose-Capillary: 148 mg/dL — ABNORMAL HIGH (ref 65–99)
Glucose-Capillary: 163 mg/dL — ABNORMAL HIGH (ref 65–99)

## 2016-02-03 MED ORDER — APIXABAN 5 MG PO TABS
5.0000 mg | ORAL_TABLET | Freq: Two times a day (BID) | ORAL | Status: DC
  Administered 2016-02-03: 5 mg
  Filled 2016-02-03 (×2): qty 1

## 2016-02-03 MED ORDER — MORPHINE SULFATE 25 MG/ML IV SOLN
10.0000 mg/h | INTRAVENOUS | Status: DC
  Administered 2016-02-03 (×3): 10 mg/h via INTRAVENOUS
  Filled 2016-02-03: qty 10

## 2016-02-03 MED ORDER — PANTOPRAZOLE SODIUM 40 MG PO PACK
40.0000 mg | PACK | Freq: Every day | ORAL | Status: DC
  Administered 2016-02-03: 40 mg
  Filled 2016-02-03: qty 20

## 2016-02-03 MED ORDER — MORPHINE BOLUS VIA INFUSION
5.0000 mg | INTRAVENOUS | Status: DC | PRN
  Filled 2016-02-03: qty 20

## 2016-02-03 DEATH — deceased

## 2016-03-05 NOTE — Progress Notes (Signed)
Trauma Service Note  Subjective: Need to have a family conference.  Patient continues to not do well and is neurologically unresponsive.  Only moans  Objective: Vital signs in last 24 hours: Temp:  [97.3 F (36.3 C)-100.4 F (38 C)] 97.3 F (36.3 C) (05/01 0400) Pulse Rate:  [90-127] 105 (05/01 0700) Resp:  [18-31] 22 (05/01 0700) BP: (96-156)/(36-92) 153/69 mmHg (05/01 0700) SpO2:  [91 %-100 %] 100 % (05/01 0700) FiO2 (%):  [40 %] 40 % (05/01 0700) Weight:  [74.4 kg (164 lb 0.4 oz)] 74.4 kg (164 lb 0.4 oz) (05/01 0400) Last BM Date: 01/30/16  Intake/Output from previous day: 04/30 0701 - 05/01 0700 In: 1190.7 [I.V.:621.5; NG/GT:269.2; IV Piggyback:300] Out: 1525 [Urine:1525] Intake/Output this shift:    General: Moans only. Seems miserable.    Lungs: Clear but diminished in the bases.  On BiPaP, sats are okay.  Last CO2 was in the high 70's.  CXR:  Yesterday done and seems clear  Abd: Soft, benign,  Had been on tube feedings.which were stopped when she returned to BiPaP.  RLQ abdominal wall ecchymotic lump is likely a hematoma  Extremities: No changes  Neuro: Intact  Lab Results: CBC  No results for input(s): WBC, HGB, HCT, PLT in the last 72 hours. BMET No results for input(s): NA, K, CL, CO2, GLUCOSE, BUN, CREATININE, CALCIUM in the last 72 hours. PT/INR No results for input(s): LABPROT, INR in the last 72 hours. ABG  Recent Labs  02/02/16 1205  PHART 7.247*  HCO3 33.7*    Studies/Results: Dg Chest Port 1 View  02/02/2016  CLINICAL DATA:  Pulmonary infiltrate. Hx of DM, HTN, and COPD. Pt is a nonsmoker. EXAM: PORTABLE CHEST 1 VIEW COMPARISON:  01/31/2016 FINDINGS: Stable orogastric tube. Heart size stable. Diffuse bilateral parenchymal infiltrates. These appear mildly more prominent when compared with the prior study, but this may be the result of decreased inspiratory effect relative to prior radiograph. Minimal blunting right costophrenic angle is new  suggesting possibility of tiny effusion. IMPRESSION: Allowing for differences in degree of inspiration, similar to slightly worse bilateral patchy diffuse infiltrates. Electronically Signed   By: Esperanza Heiraymond  Rubner M.D.   On: 02/02/2016 07:47    Anti-infectives: Anti-infectives    Start     Dose/Rate Route Frequency Ordered Stop   01/31/16 1100  levofloxacin (LEVAQUIN) IVPB 500 mg     500 mg 100 mL/hr over 60 Minutes Intravenous Every 48 hours 01/29/16 1047 01/31/16 1255   01/29/16 1100  levofloxacin (LEVAQUIN) IVPB 750 mg     750 mg 100 mL/hr over 90 Minutes Intravenous  Once 01/29/16 1046 01/29/16 1304   01/14/2016 1700  levofloxacin (LEVAQUIN) tablet 750 mg  Status:  Discontinued     750 mg Oral Every 48 hours 01/27/2016 1647 01/29/16 1046      Assessment/Plan: s/p  Restart tube feedings, Eliquis, Protonix.  Check HGB  Family conference today to decide goals of care and long term plans.  LOS: 10 days   Marta LamasJames O. Gae BonWyatt, III, MD, FACS [phone] Trauma Surgeon 02/24/2016

## 2016-03-05 NOTE — Progress Notes (Signed)
Patient asystole on monitor.  No heart sounds or breath sounds on auscultation at 1442.  Pupils non-reactive to light. Findings verified by Michail JewelsJanice Coble RN.  Daughter Arline AspCindy at bedside.   Jacqulyn Canehristopher Scott Zyeir Dymek RN, BSN, CCRN
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
## 2016-03-05 NOTE — Progress Notes (Signed)
PT Cancellation Note  Patient Details Name: [name] MRN: [id number] DOB: 11/21/1940   Cancelled Treatment:    Reason Eval/Treat Not Completed: Medical issues which prohibited therapy (Palliative meeting today per nursing. Pt on Bipap. HOLD.  )Will return at later date.  Thanks.    Tawni MillersWhite, Naphtali Riede F 02/20/2016, 8:58 AM  Eber Jonesawn Maziah Smola,PT Acute Rehabilitation [phone] (pager)
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
## 2016-03-05 NOTE — Progress Notes (Signed)
Rehab admissions - Noted patient has now transitioned to comfort care.  I will sign off for acute inpatient rehab admissions.  #[phone]

## 2016-03-05 NOTE — Progress Notes (Signed)
OT Cancellation Note  Patient Details Name: [name] MRN: [id number] DOB: 04/10/1941   Cancelled Treatment:    Reason Eval/Treat Not Completed: Medical issues which prohibited therapy (Remains on bipap, goals of care meeting later today.) Will follow.  Evern BioMayberry, [name] 02/18/2016, 8:58 AM

## 2016-03-05 NOTE — Clinical Social Work Note (Signed)
Clinical Social Worker continuing to follow patient and family for support and discharge planning needs.  CSW participated in family meeting with MD, CM, PA and patient family.  Patient family has opted to transition patient to comfort care at this time.  CSW to follow up with patient family pending patient decline and potential options if hospital death is not anticipated.  CSW remains available for support to patient family.  Macario GoldsJesse Itzia Cunliffe, [place] [phone]

## 2016-03-05 NOTE — Progress Notes (Signed)
Rehab admissions - Following from a distance.  I met with husband at the bedside.  Noted plans for possible family meeting today.  Call me for questions.  #[phone]

## 2016-03-05 NOTE — Progress Notes (Signed)
215ml Morphine infusion wasted in sink. Witnessed by Catalina LungerNicole Zafiris RN.  Jacqulyn Canehristopher Scott Wendle Kina RN, BSN, CCRN

## 2016-03-05 NOTE — Progress Notes (Signed)
Pt remains on BIPAP and is neurologically unresponsive, per MD notes.  Planning family meeting with MD to discuss goals of care ASAP.  Spouse at bedside, and is aware of need for meeting--states he will contact other family members.  Left message for Marla Roendrea Jenkins, pt's daughter, to discuss possible family meeting later today.  Will follow.    Quintella BatonJulie W. Namira Rosekrans, RN, BSN  Trauma/Neuro ICU Case Manager [phone]

## 2016-03-05 NOTE — Progress Notes (Addendum)
Bed placement made aware of patient being transported to morgue. Darrel HooverWilson,Zarian Colpitts S 6:28 PM   Pt transferred to morgue via Liborio NixonJanice RN & Deanna ArtisKeisha NT. Darrel HooverWilson,Genesee Nase S 6:48 PM

## 2016-03-05 NOTE — Progress Notes (Signed)
   02/29/2016 1300  Clinical Encounter Type  Visited With Patient and family together;Health care provider  Visit Type Psychological support;Spiritual support;Social support  Referral From Nurse;Care management  Consult/Referral To Chaplain;Faith community  Spiritual Encounters  Spiritual Needs Prayer;Emotional  Stress Factors  Patient Stress Factors None identified  Family Stress Factors Loss of control   Chaplain stopped to give support to the family via prayer. Please page chaplain if family is in need of more support.

## 2016-03-05 NOTE — Progress Notes (Signed)
Pt taken off bipap per Withdrawal of Life protocol. Pt on Room Air. Pt NT suctioned x 2 in left nare and oral suctioned as well with small amount of very thick white/pink tinged secretions removed along with several dried blood specimens. Rt will continue to monitor.
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
## 2016-03-05 DEATH — deceased
